# Patient Record
Sex: Male | Born: 1955 | Race: White | Hispanic: No | Marital: Married | Smoking: Former smoker
Health system: Southern US, Community
[De-identification: ages and names within clinical notes are randomized; demographics above are authoritative.]

## PROBLEM LIST (undated history)

## (undated) DIAGNOSIS — I2510 Atherosclerotic heart disease of native coronary artery without angina pectoris: Secondary | ICD-10-CM

## (undated) DIAGNOSIS — J449 Chronic obstructive pulmonary disease, unspecified: Secondary | ICD-10-CM

## (undated) DIAGNOSIS — E7800 Pure hypercholesterolemia, unspecified: Secondary | ICD-10-CM

## (undated) DIAGNOSIS — D689 Coagulation defect, unspecified: Secondary | ICD-10-CM

## (undated) DIAGNOSIS — I10 Essential (primary) hypertension: Secondary | ICD-10-CM

## (undated) DIAGNOSIS — I219 Acute myocardial infarction, unspecified: Secondary | ICD-10-CM

## (undated) DIAGNOSIS — C259 Malignant neoplasm of pancreas, unspecified: Secondary | ICD-10-CM

## (undated) DIAGNOSIS — E119 Type 2 diabetes mellitus without complications: Secondary | ICD-10-CM

## (undated) HISTORY — DX: Coagulation defect, unspecified: D68.9

## (undated) HISTORY — DX: Essential (primary) hypertension: I10

## (undated) HISTORY — PX: CARDIAC SURGERY: SHX584

## (undated) HISTORY — DX: Type 2 diabetes mellitus without complications: E11.9

## (undated) HISTORY — PX: BACK SURGERY: SHX140

## (undated) HISTORY — DX: Pure hypercholesterolemia, unspecified: E78.00

## (undated) HISTORY — DX: Malignant neoplasm of pancreas, unspecified: C25.9

## (undated) HISTORY — DX: Chronic obstructive pulmonary disease, unspecified: J44.9

## (undated) HISTORY — PX: HERNIA REPAIR: SHX51

## (undated) HISTORY — PX: TONSILLECTOMY: SUR1361

## (undated) HISTORY — DX: Atherosclerotic heart disease of native coronary artery without angina pectoris: I25.10

## (undated) HISTORY — DX: Acute myocardial infarction, unspecified: I21.9

---

## 1995-09-27 HISTORY — PX: LUMBAR FUSION: SHX111

## 2000-12-25 ENCOUNTER — Inpatient Hospital Stay (HOSPITAL_COMMUNITY): Admission: AD | Admit: 2000-12-25 | Discharge: 2000-12-27 | Payer: Self-pay | Admitting: Cardiology

## 2001-09-26 HISTORY — PX: OTHER SURGICAL HISTORY: SHX169

## 2003-12-25 ENCOUNTER — Encounter: Admission: RE | Admit: 2003-12-25 | Discharge: 2003-12-25 | Payer: Self-pay | Admitting: Anesthesiology

## 2005-01-06 ENCOUNTER — Ambulatory Visit: Payer: Self-pay | Admitting: Cardiology

## 2005-03-30 ENCOUNTER — Ambulatory Visit: Payer: Self-pay | Admitting: Cardiology

## 2015-12-01 ENCOUNTER — Other Ambulatory Visit: Payer: Self-pay

## 2015-12-01 ENCOUNTER — Encounter: Payer: Self-pay | Admitting: Surgery

## 2015-12-01 DIAGNOSIS — I739 Peripheral vascular disease, unspecified: Secondary | ICD-10-CM

## 2015-12-18 ENCOUNTER — Encounter: Payer: Self-pay | Admitting: Surgery

## 2015-12-28 ENCOUNTER — Ambulatory Visit (INDEPENDENT_AMBULATORY_CARE_PROVIDER_SITE_OTHER): Payer: Medicare PPO | Admitting: Surgery

## 2015-12-28 ENCOUNTER — Encounter: Payer: Self-pay | Admitting: Surgery

## 2015-12-28 ENCOUNTER — Ambulatory Visit (HOSPITAL_COMMUNITY)
Admission: RE | Admit: 2015-12-28 | Discharge: 2015-12-28 | Disposition: A | Discharge status: Home / Self Care | Payer: Medicare PPO | Source: Ambulatory Visit | Attending: Surgery | Admitting: Surgery

## 2015-12-28 ENCOUNTER — Other Ambulatory Visit: Payer: Self-pay | Admitting: Surgery

## 2015-12-28 ENCOUNTER — Ambulatory Visit (INDEPENDENT_AMBULATORY_CARE_PROVIDER_SITE_OTHER)
Admission: RE | Admit: 2015-12-28 | Discharge: 2015-12-28 | Disposition: A | Discharge status: Home / Self Care | Payer: Medicare PPO | Source: Ambulatory Visit | Attending: Surgery | Admitting: Surgery

## 2015-12-28 VITALS — BP 116/77 | HR 74 | Temp 97.9°F | Resp 18 | Ht 69.0 in | Wt 220.0 lb

## 2015-12-28 DIAGNOSIS — I252 Old myocardial infarction: Secondary | ICD-10-CM | POA: Diagnosis not present

## 2015-12-28 DIAGNOSIS — E119 Type 2 diabetes mellitus without complications: Secondary | ICD-10-CM | POA: Diagnosis not present

## 2015-12-28 DIAGNOSIS — J449 Chronic obstructive pulmonary disease, unspecified: Secondary | ICD-10-CM | POA: Insufficient documentation

## 2015-12-28 DIAGNOSIS — I739 Peripheral vascular disease, unspecified: Secondary | ICD-10-CM | POA: Insufficient documentation

## 2015-12-28 DIAGNOSIS — R0989 Other specified symptoms and signs involving the circulatory and respiratory systems: Secondary | ICD-10-CM | POA: Diagnosis present

## 2015-12-28 DIAGNOSIS — I2510 Atherosclerotic heart disease of native coronary artery without angina pectoris: Secondary | ICD-10-CM | POA: Diagnosis not present

## 2015-12-28 DIAGNOSIS — I10 Essential (primary) hypertension: Secondary | ICD-10-CM | POA: Diagnosis not present

## 2015-12-28 MED ORDER — CILOSTAZOL 100 MG PO TABS: 100.0000 mg | ORAL_TABLET | Freq: Two times a day (BID) | ORAL | Status: DC

## 2015-12-28 NOTE — Addendum Note (Signed)
Addended by: Reola Calkins on: 12/28/2015 03:56 PM   Modules accepted: Orders

## 2015-12-28 NOTE — Progress Notes (Signed)
History of Present Illness:  Patient is a 60 y.o. year old male who presents for evaluation of claudication.  The patient currently describes a cramping sensation in the both lower extremity. Most of his pain starts with walking up hills and climbing stairs.  He can walk around [employer]  a few times on good days, but sometimes he has to use a drivable cart.  There is not rest pain.  There is no history of ulcerations on the feet.  Atherosclerotic risk factors and other medical problems include history of smoking, he quit in 2006, CAD, DM, HTN managed with lisinopril and atenolol, and hyperlipidemia managed with Lipitor.    Past Medical History  Diagnosis Date  . Diabetes mellitus without complication ([place])   . Hypertension   . COPD (chronic obstructive pulmonary disease) ([place])   . CAD (coronary artery disease)   . Myocardial infarction [facility])     Past Surgical History  Procedure Laterality Date  . Hemilaminectomy  2003  . Lumbar fusion  1997    L4-5 X2    ROS:   General:  No weight loss, Fever, chills  HEENT: No recent headaches, no nasal bleeding, no visual changes, no sore throat  Neurologic: No dizziness, blackouts, seizures. No recent symptoms of stroke or mini- stroke. No recent episodes of slurred speech, or temporary blindness.  Cardiac: No recent episodes of chest pain/pressure, no shortness of breath at rest.  No shortness of breath with exertion.  Denies history of atrial fibrillation or irregular heartbeat  Vascular: No history of rest pain in feet.  No history of claudication.  No history of non-healing ulcer, No history of DVT   Pulmonary: No home oxygen, no productive cough, no hemoptysis,  No asthma or wheezing  Musculoskeletal:  [ ]  Arthritis, [ ]  Low back pain,  [ ]  Joint pain  Hematologic:No history of hypercoagulable state.  No history of easy bleeding.  No history of anemia  Gastrointestinal: No hematochezia or melena,  No gastroesophageal reflux, no  trouble swallowing  Urinary: [ ]  chronic Kidney disease, [ ]  on HD - [ ]  MWF or [ ]  TTHS, [ ]  Burning with urination, [ ]  Frequent urination, [ ]  Difficulty urinating;   Skin: No rashes  Psychological: No history of anxiety,  No history of depression  Social History Social History  Substance Use Topics  . Smoking status: Former Smoker    Quit date: 11/21/2004  . Smokeless tobacco: None  . Alcohol Use: No    Family History Family History  Problem Relation Age of Onset  . Heart disease Brother     prior to age 8    Allergies  No Known Allergies   Current Outpatient Prescriptions  Medication Sig Dispense Refill  . aspirin 81 MG tablet Take 81 mg by mouth daily.    Marland Kitchen atenolol (TENORMIN) 100 MG tablet Take 1 tablet by mouth daily.    Marland Kitchen atorvastatin (LIPITOR) 10 MG tablet     . gabapentin (NEURONTIN) 600 MG tablet Take 600 mg by mouth 2 (two) times daily.    Marland Kitchen gemfibrozil (LOPID) 600 MG tablet Take 1 tablet by mouth daily.    Marland Kitchen glimepiride (AMARYL) 4 MG tablet Take 1 tablet by mouth 2 (two) times daily.    Marland Kitchen lisinopril (PRINIVIL,ZESTRIL) 20 MG tablet Take 1 tablet by mouth daily.    . metFORMIN (GLUCOPHAGE) 500 MG tablet Take 1 tablet by mouth. 2 tablets every morning and 1 tablet every evening    .  pioglitazone (ACTOS) 15 MG tablet Take 1 tablet by mouth daily.     No current facility-administered medications for this visit.    Physical Examination  Filed Vitals:   12/28/15 1303  BP: 116/77  Pulse: 74  Temp: 97.9 F (36.6 C)  TempSrc: Oral  Resp: 18  Height: 5\' 9"  (1.753 m)  Weight: 220 lb (99.791 kg)  SpO2: 94%    Body mass index is 32.47 kg/(m^2).  General:  Alert and oriented, no acute distress HEENT: Normal Neck: No bruit or JVD Pulmonary: Clear to auscultation bilaterally Cardiac: Regular Rate and Rhythm without murmur Abdomen: Soft, non-tender, non-distended, no mass, no scars Skin: No rash Extremity Pulses:  2+ radial, brachial, femoral, dorsalis  pedis, posterior tibial pulses bilaterally Musculoskeletal: No deformity or edema  Neurologic: Upper and lower extremity motor 5/5 and symmetric  DATA:  Arterial duplex 12/28/2015 Shows SFA occlusion with popliteal reconstitution on the right LE, left SFA is patent until the mid section then reconstitute in the popliteal.  ABI's Right 0.74 Left 0.70 Monophasic flow below the femoral arteries    ASSESSMENT/PLAN: Claudication bilateral LE Bilateral SFA occlusions  We want him to start a walking program 30-40 min a day and PO medication cilostazol BID.   This will include continuation maximum medical management with hi current medications lisinopril, atenolol and Lipitor.   He states that he wants to get better, but he is willing to try these recommendations for 3 months.  If he is no better or worse we will plan angiogram and the possibility of revascularization.      Theda Sers EMMA [facility] PA-C [facility] [place] Office: [phone]  The patient was seen in conjunction with Dr. Trula Slade today.  I agree with the above.  I have seen and evaluated the patient.  Briefly, this is a 60 year old gentleman who describes approximately a one-year history of bilateral claudication, right greater than left.  ABIs in our office today) the 0.7 range.  Duplex shows occluded bilateral superficial femoral arteries.  I discussed the importance of maximal medical therapy as well as the initiation of a exercise program and a trial of cilostazol.  The patient was instructed that if he develops any signs of ischemia to contact me immediately.  Otherwise I will have him follow-up in 3 months.  Annamarie Major

## 2016-01-11 ENCOUNTER — Telehealth: Payer: Self-pay

## 2016-01-11 ENCOUNTER — Telehealth: Payer: Self-pay | Admitting: Surgery

## 2016-01-11 NOTE — Telephone Encounter (Signed)
Spoke to pt to sch appt for 4/24 at 3:15.

## 2016-01-11 NOTE — Telephone Encounter (Signed)
-----   Message from Denman George, RN sent at 01/11/2016 11:43 AM EDT ----- Regarding: needs appt. with [facility] Contact: [phone] Please schedule this pt. to see Dr. Trula Slade at next available appt time.  Last seen in office 12/28/15; was prescribed Cilastazol, and unable to take due to side effects of headaches, chills, and becoming emotional.  Requesting to reschedule appt. due to continued leg pain.

## 2016-01-11 NOTE — Telephone Encounter (Signed)
Pt's wife called to schedule pt. An appt. To see Dr. Trula Slade.  Reported that the pt. Spoke with Dr. Trula Slade on 4/14, and reported he cannot tolerate the Cilastazol, due to side effects of headaches, chills, and becoming emotional.  Reported he was advised to stop taking the Cilastazol.  Wife reported his legs are still very painpul, and requesting another appt. to see Dr. Trula Slade.  Advised will have an appointment scheduler contact pt. with another appt.  Wife agreed.

## 2016-01-14 ENCOUNTER — Encounter: Payer: Self-pay | Admitting: Surgery

## 2016-01-18 ENCOUNTER — Ambulatory Visit (INDEPENDENT_AMBULATORY_CARE_PROVIDER_SITE_OTHER): Payer: Medicare PPO | Admitting: Surgery

## 2016-01-18 ENCOUNTER — Encounter: Payer: Self-pay | Admitting: Surgery

## 2016-01-18 VITALS — BP 131/84 | HR 70 | Ht 69.0 in | Wt 229.0 lb

## 2016-01-18 DIAGNOSIS — I70213 Atherosclerosis of native arteries of extremities with intermittent claudication, bilateral legs: Secondary | ICD-10-CM | POA: Diagnosis not present

## 2016-01-18 DIAGNOSIS — I739 Peripheral vascular disease, unspecified: Secondary | ICD-10-CM | POA: Diagnosis not present

## 2016-01-18 NOTE — Progress Notes (Signed)
[facility]  Patient name: Jake Gonzalez MRN: [id number] DOB: 10-10-1955 Sex: male  REASON FOR VISIT: Follow-up  HPI: Jake Gonzalez is a 60 y.o. male who was seen several weeks ago for bilateral claudication.  He underwent duplex imaging which revealed ankle-brachial indices in the 0.7 range with bilateral superficial femoral artery occlusion.  We discussed the importance of maximal medical therapy as well as the initiation of an exercise program and a trial of cilostazol.  He did not tolerate the cilostazol secondary to headaches and just feeling poorly.  He is back today having stopped the medication but feeling better.  Past Medical History  Diagnosis Date  . Diabetes mellitus without complication ([place])   . Hypertension   . COPD (chronic obstructive pulmonary disease) ([place])   . CAD (coronary artery disease)   . Myocardial infarction [facility])     Family History  Problem Relation Age of Onset  . Heart disease Brother     prior to age 49    SOCIAL HISTORY: Social History  Substance Use Topics  . Smoking status: Former Smoker    Quit date: 11/21/2004  . Smokeless tobacco: Not on file  . Alcohol Use: No    No Known Allergies  Current Outpatient Prescriptions  Medication Sig Dispense Refill  . aspirin 81 MG tablet Take 81 mg by mouth daily.    Marland Kitchen atenolol (TENORMIN) 100 MG tablet Take 1 tablet by mouth daily.    Marland Kitchen atorvastatin (LIPITOR) 10 MG tablet     . cilostazol (PLETAL) 100 MG tablet Take 1 tablet (100 mg total) by mouth 2 (two) times daily before a meal. 60 tablet 0  . gabapentin (NEURONTIN) 600 MG tablet Take 600 mg by mouth 2 (two) times daily.    Marland Kitchen gemfibrozil (LOPID) 600 MG tablet Take 1 tablet by mouth daily.    Marland Kitchen glimepiride (AMARYL) 4 MG tablet Take 1 tablet by mouth 2 (two) times daily.    Marland Kitchen lisinopril (PRINIVIL,ZESTRIL) 20 MG tablet Take 1 tablet by mouth daily.    . metFORMIN (GLUCOPHAGE) 500 MG tablet Take 1 tablet by mouth.  2 tablets every morning and 1 tablet every evening    . pioglitazone (ACTOS) 15 MG tablet Take 1 tablet by mouth daily.     No current facility-administered medications for this visit.    REVIEW OF SYSTEMS:  [X]  denotes positive finding, [ ]  denotes negative finding Cardiac  Comments:  Chest pain or chest pressure:    Shortness of breath upon exertion:    Short of breath when lying flat:    Irregular heart rhythm:        Vascular    Pain in calf, thigh, or hip brought on by ambulation: x   Pain in feet at night that wakes you up from your sleep:     Blood clot in your veins:    Leg swelling:         Pulmonary    Oxygen at home:    Productive cough:     Wheezing:         Neurologic    Sudden weakness in arms or legs:     Sudden numbness in arms or legs:     Sudden onset of difficulty speaking or slurred speech:    Temporary loss of vision in one eye:     Problems with dizziness:         Gastrointestinal    Blood in stool:  Vomited blood:         Genitourinary    Burning when urinating:     Blood in urine:        Psychiatric    Major depression:         Hematologic    Bleeding problems:    Problems with blood clotting too easily:        Skin    Rashes or ulcers:        Constitutional    Fever or chills:      PHYSICAL EXAM: Filed Vitals:   01/18/16 1535  BP: 131/84  Pulse: 70  Height: 5\' 9"  (1.753 m)  Weight: 229 lb (103.874 kg)  SpO2: 95%    GENERAL: The patient is a well-nourished male, in no acute distress. The vital signs are documented above. CARDIAC: There is a regular rate and rhythm.  VASCULAR: Pedal pulses are not palpable PULMONARY: There is good air exchange bilaterally without wheezing or rales. ABDOMEN: Soft and non-tender with normal pitched bowel sounds.  MUSCULOSKELETAL: There are no major deformities or cyanosis. NEUROLOGIC: No focal weakness or paresthesias are detected. SKIN: There are no ulcers or rashes noted. PSYCHIATRIC:  The patient has a normal affect.  DATA:  None  MEDICAL ISSUES: I do not think that the patient's symptoms warrant intervention at this time.  We have extensively discussed with him how to participate in a walking program and to try to help develop more collateral blood flow to help improve his walking distance.  He is going to try to do this over the next year and will follow up with me with repeat ABIs in one year.  He understands if he develops a wound that does not heal in his foot to contact me.    Annamarie Major [facility] [employer]: [phone]

## 2016-02-23 ENCOUNTER — Other Ambulatory Visit: Payer: Self-pay | Admitting: *Deleted

## 2016-02-23 DIAGNOSIS — I739 Peripheral vascular disease, unspecified: Secondary | ICD-10-CM

## 2016-04-04 ENCOUNTER — Ambulatory Visit: Payer: Medicare PPO | Admitting: Surgery

## 2016-06-07 ENCOUNTER — Encounter (HOSPITAL_COMMUNITY): Payer: Medicare PPO | Attending: Hematology & Oncology | Admitting: Hematology & Oncology

## 2016-06-07 ENCOUNTER — Encounter (HOSPITAL_COMMUNITY): Payer: Self-pay | Admitting: Hematology & Oncology

## 2016-06-07 VITALS — BP 126/66 | HR 85 | Temp 98.9°F | Resp 16 | Ht 67.0 in | Wt 210.8 lb

## 2016-06-07 DIAGNOSIS — I252 Old myocardial infarction: Secondary | ICD-10-CM | POA: Diagnosis not present

## 2016-06-07 DIAGNOSIS — K8689 Other specified diseases of pancreas: Secondary | ICD-10-CM

## 2016-06-07 DIAGNOSIS — R160 Hepatomegaly, not elsewhere classified: Secondary | ICD-10-CM | POA: Diagnosis not present

## 2016-06-07 DIAGNOSIS — Z7901 Long term (current) use of anticoagulants: Secondary | ICD-10-CM | POA: Insufficient documentation

## 2016-06-07 DIAGNOSIS — C787 Secondary malignant neoplasm of liver and intrahepatic bile duct: Secondary | ICD-10-CM | POA: Insufficient documentation

## 2016-06-07 DIAGNOSIS — D689 Coagulation defect, unspecified: Secondary | ICD-10-CM | POA: Insufficient documentation

## 2016-06-07 DIAGNOSIS — R918 Other nonspecific abnormal finding of lung field: Secondary | ICD-10-CM

## 2016-06-07 DIAGNOSIS — I10 Essential (primary) hypertension: Secondary | ICD-10-CM | POA: Insufficient documentation

## 2016-06-07 DIAGNOSIS — R634 Abnormal weight loss: Secondary | ICD-10-CM

## 2016-06-07 DIAGNOSIS — Z79899 Other long term (current) drug therapy: Secondary | ICD-10-CM | POA: Diagnosis not present

## 2016-06-07 DIAGNOSIS — F418 Other specified anxiety disorders: Secondary | ICD-10-CM

## 2016-06-07 DIAGNOSIS — C7800 Secondary malignant neoplasm of unspecified lung: Secondary | ICD-10-CM | POA: Insufficient documentation

## 2016-06-07 DIAGNOSIS — R1084 Generalized abdominal pain: Secondary | ICD-10-CM | POA: Diagnosis not present

## 2016-06-07 DIAGNOSIS — R161 Splenomegaly, not elsewhere classified: Secondary | ICD-10-CM | POA: Diagnosis not present

## 2016-06-07 DIAGNOSIS — C259 Malignant neoplasm of pancreas, unspecified: Secondary | ICD-10-CM | POA: Diagnosis not present

## 2016-06-07 DIAGNOSIS — G893 Neoplasm related pain (acute) (chronic): Secondary | ICD-10-CM | POA: Diagnosis not present

## 2016-06-07 DIAGNOSIS — Z87891 Personal history of nicotine dependence: Secondary | ICD-10-CM | POA: Diagnosis not present

## 2016-06-07 DIAGNOSIS — Z7984 Long term (current) use of oral hypoglycemic drugs: Secondary | ICD-10-CM | POA: Insufficient documentation

## 2016-06-07 DIAGNOSIS — E7800 Pure hypercholesterolemia, unspecified: Secondary | ICD-10-CM | POA: Insufficient documentation

## 2016-06-07 DIAGNOSIS — E119 Type 2 diabetes mellitus without complications: Secondary | ICD-10-CM | POA: Insufficient documentation

## 2016-06-07 DIAGNOSIS — I82402 Acute embolism and thrombosis of unspecified deep veins of left lower extremity: Secondary | ICD-10-CM

## 2016-06-07 DIAGNOSIS — R591 Generalized enlarged lymph nodes: Secondary | ICD-10-CM | POA: Diagnosis not present

## 2016-06-07 DIAGNOSIS — K869 Disease of pancreas, unspecified: Secondary | ICD-10-CM

## 2016-06-07 DIAGNOSIS — J449 Chronic obstructive pulmonary disease, unspecified: Secondary | ICD-10-CM | POA: Diagnosis not present

## 2016-06-07 DIAGNOSIS — I2510 Atherosclerotic heart disease of native coronary artery without angina pectoris: Secondary | ICD-10-CM | POA: Insufficient documentation

## 2016-06-07 LAB — COMPREHENSIVE METABOLIC PANEL
ALK PHOS: 154 U/L — AB (ref 38–126)
ALT: 23 U/L (ref 17–63)
ANION GAP: 7 (ref 5–15)
AST: 31 U/L (ref 15–41)
Albumin: 3.8 g/dL (ref 3.5–5.0)
BILIRUBIN TOTAL: 0.5 mg/dL (ref 0.3–1.2)
BUN: 20 mg/dL (ref 6–20)
CALCIUM: 8.8 mg/dL — AB (ref 8.9–10.3)
CO2: 27 mmol/L (ref 22–32)
Chloride: 97 mmol/L — ABNORMAL LOW (ref 101–111)
Creatinine, Ser: 0.93 mg/dL (ref 0.61–1.24)
Glucose, Bld: 128 mg/dL — ABNORMAL HIGH (ref 65–99)
Potassium: 4.9 mmol/L (ref 3.5–5.1)
Sodium: 131 mmol/L — ABNORMAL LOW (ref 135–145)
TOTAL PROTEIN: 7.8 g/dL (ref 6.5–8.1)

## 2016-06-07 LAB — CBC WITH DIFFERENTIAL/PLATELET
Basophils Absolute: 0.1 10*3/uL (ref 0.0–0.1)
Basophils Relative: 1 %
EOS ABS: 0.1 10*3/uL (ref 0.0–0.7)
Eosinophils Relative: 1 %
HEMATOCRIT: 40 % (ref 39.0–52.0)
HEMOGLOBIN: 13 g/dL (ref 13.0–17.0)
LYMPHS ABS: 2.1 10*3/uL (ref 0.7–4.0)
Lymphocytes Relative: 20 %
MCH: 28.6 pg (ref 26.0–34.0)
MCHC: 32.5 g/dL (ref 30.0–36.0)
MCV: 87.9 fL (ref 78.0–100.0)
MONOS PCT: 13 %
Monocytes Absolute: 1.3 10*3/uL — ABNORMAL HIGH (ref 0.1–1.0)
NEUTROS ABS: 6.8 10*3/uL (ref 1.7–7.7)
NEUTROS PCT: 65 %
Platelets: 194 10*3/uL (ref 150–400)
RBC: 4.55 MIL/uL (ref 4.22–5.81)
RDW: 13.4 % (ref 11.5–15.5)
WBC: 10.4 10*3/uL (ref 4.0–10.5)

## 2016-06-07 MED ORDER — ONDANSETRON HCL 8 MG PO TABS: 8.0000 mg | ORAL_TABLET | Freq: Three times a day (TID) | ORAL | 1 refills | Status: DC | PRN

## 2016-06-07 MED ORDER — LORAZEPAM 0.5 MG PO TABS: 0.5000 mg | ORAL_TABLET | ORAL | 0 refills | Status: AC | PRN

## 2016-06-07 MED ORDER — MORPHINE SULFATE ER 15 MG PO TBCR: EXTENDED_RELEASE_TABLET | ORAL | 0 refills | Status: DC

## 2016-06-07 MED ORDER — MORPHINE SULFATE ER 30 MG PO TBCR: EXTENDED_RELEASE_TABLET | ORAL | 0 refills | Status: DC

## 2016-06-07 NOTE — Progress Notes (Signed)
Jake Gonzalez NOTE  Patient Care Team: Jake Chroman, MD as PCP - General (Internal Medicine) Jake Situ, MD (Pain Medicine)  CHIEF COMPLAINTS/PURPOSE OF CONSULTATION:  Abdominal pain Pancreatic mass Liver mass Lymphadenopathy Splenic mass Bilateral adrenal nodules Multiple pulmonary nodules  HISTORY OF PRESENTING ILLNESS:  Jake Gonzalez 60 y.o. male is here for a consult due to abnormal CT imaging of the C/A/P.   Patient is accompanied by wife, son, and son's girlfriend.  Jake Gonzalez has been experiencing abdominal pain, nausea, vomiting, anxiety, tingling in his hands and feet. He is tearful frequently throughout our visit today.  He has chronic LBP and is on a pain contract. Patient is on long acting morphine 30 mg and 15 mg for break through, these are his prior chronic pain medications. . He does not feel like it is alleviating his new/current pain to the full extent but it is helping him sleep at night and it is reducing the pain in his side. He says he feels like he may need a a higher dose.  When I asked him if he has considered a morphine patch, he said that his insurance will not cover it. Although patient says he does not wake up with pain, he begins to experience it as the day progresses. Patient is constipated from pain medicine.  He notes that about a month ago he developed LLE swelling and pain, he was diagnosed with a DVT. He was originally started on Eliquis. He began having problems with abdominal pain, nausea and vomiting which he initially attributed to the Xarelto. He is now on  (5 mg eliquis).  About two weeks ago, he began having abdominal pain and that is what led to him getting a CT scan. He sees Dr. Woody Gonzalez for his primary care needs. He notes the abdominal pain is cramping in nature and radiates into his back and LLQ. He was given bentyl which has helped some with his pain. He was having significant nausea and vomiting but since using bentyl,  this has improved. He states that the scans revealed a tumor in his pancreas and also he thinks some areas of the liver may be affected.   Jake Gonzalez has been taking bentyl for nausea. He still has difficulty eating and his wife says he can only eat small portions. He has lost about twenty pounds. He is no longer vomiting.He notes that his weight loss has currently stabilized.   MEDICAL HISTORY:  Past Medical History:  Diagnosis Date  . CAD (coronary artery disease)   . Clotting disorder ([place])   . COPD (chronic obstructive pulmonary disease) ([place])   . Diabetes mellitus without complication ([place])   . High cholesterol   . Hypertension   . Myocardial infarction ([place])   . Pancreatic cancer [facility])     SURGICAL HISTORY: Past Surgical History:  Procedure Laterality Date  . BACK SURGERY    . hemilaminectomy  2003  . HERNIA REPAIR    . LUMBAR FUSION  1997   L4-5 X2  . TONSILLECTOMY      SOCIAL HISTORY: Social History   Social History  . Marital status: Married    Spouse name: N/A  . Number of children: N/A  . Years of education: N/A   Occupational History  . Not on file.   Social History Main Topics  . Smoking status: Former Smoker    Packs/day: 2.00    Years: 35.00    Quit date: 11/21/2004  . Smokeless  tobacco: Never Used  . Alcohol use No  . Drug use: No  . Sexual activity: Not on file     Comment: married- 2 sons   Other Topics Concern  . Not on file   Social History Narrative  . No narrative on file  Jake Gonzalez's been married since 2001 to his second wife. He has two children. 4 great grandchildren.  He worked in [employer].  Patient does not consume alcohol.  Patient quit smoking Feb. 6, 2006  FAMILY HISTORY: Family History  Problem Relation Age of Onset  . Heart disease Brother     prior to age 26  . Stroke Brother   . Hypertension Brother   . Cancer Mother     ovarian  . Alcoholism Father   . Cancer Maternal Uncle     stomach  . Other Brother     MVA    Mother had ovarian cancer and stomach cancer; deceased at 74 years old Uncle possibly had stomach cancer Father was an alcoholic and died at 43 years old. Most of father's family was alcoholic. Brother had heart attack  ALLERGIES:  has No Known Allergies.  MEDICATIONS:  Current Outpatient Prescriptions  Medication Sig Dispense Refill  . apixaban (ELIQUIS) 5 MG TABS tablet Take 5 mg by mouth at bedtime.    Marland Kitchen atenolol (TENORMIN) 100 MG tablet Take 1 tablet by mouth daily.    Marland Kitchen atorvastatin (LIPITOR) 10 MG tablet     . cilostazol (PLETAL) 100 MG tablet Take 1 tablet (100 mg total) by mouth 2 (two) times daily before a meal. 60 tablet 0  . dicyclomine (BENTYL) 10 MG capsule Take 10 mg by mouth 3 (three) times daily as needed for spasms.    Marland Kitchen gemfibrozil (LOPID) 600 MG tablet Take 1 tablet by mouth daily.    Marland Kitchen glimepiride (AMARYL) 4 MG tablet Take 1 tablet by mouth 2 (two) times daily.    Marland Kitchen lisinopril (PRINIVIL,ZESTRIL) 20 MG tablet Take 1 tablet by mouth daily.    . metFORMIN (GLUCOPHAGE) 500 MG tablet Take 1 tablet by mouth. 2 tablets every morning and 1 tablet every evening    . morphine (MS CONTIN) 15 MG 12 hr tablet Take 15 mg by mouth every 4 (four) hours as needed for pain.    Marland Kitchen morphine (MS CONTIN) 30 MG 12 hr tablet Take 30 mg by mouth every 12 (twelve) hours.    . pioglitazone (ACTOS) 15 MG tablet Take 1 tablet by mouth daily.    . Red Yeast Rice 600 MG CAPS Take 1 capsule by mouth daily.    Marland Kitchen LORazepam (ATIVAN) 0.5 MG tablet Take 1 tablet (0.5 mg total) by mouth every 4 (four) hours as needed for anxiety (and nausea). 60 tablet 0  . morphine (MS CONTIN) 15 MG 12 hr tablet Take one 15 mg tab and one 30 mg tab to equal 45 mg every 12 hours. 60 tablet 0  . morphine (MS CONTIN) 30 MG 12 hr tablet Take one 15 mg tab and one 30 mg tab to equal 45 mg every 12 hours 60 tablet 0  . ondansetron (ZOFRAN) 8 MG tablet Take 1 tablet (8 mg total) by mouth every 8 (eight) hours as needed for nausea  or vomiting. 30 tablet 1   No current facility-administered medications for this visit.     Review of Systems  Constitutional: Positive for malaise/fatigue and weight loss.  HENT: Negative.  Negative for ear pain, hearing loss and tinnitus.  Eyes: Negative.   Respiratory: Negative.  Negative for cough, hemoptysis, sputum production, shortness of breath and wheezing.   Cardiovascular: Negative.  Negative for chest pain, palpitations, orthopnea, claudication, leg swelling and PND.  Gastrointestinal: Positive for abdominal pain, constipation and nausea. Negative for blood in stool, heartburn, melena and vomiting.  Genitourinary: Negative.  Negative for dysuria, flank pain, frequency, hematuria and urgency.  Musculoskeletal: Positive for back pain and joint pain.  Skin: Negative.   Neurological: Positive for tingling and weakness. Negative for headaches.       Tingling in hands and feet  Endo/Heme/Allergies: Negative.   Psychiatric/Behavioral: Negative for depression, hallucinations, memory loss, substance abuse and suicidal ideas. The patient is nervous/anxious. The patient does not have insomnia.   All other systems reviewed and are negative.  14 point ROS was done and is otherwise as detailed above or in HPI   PHYSICAL EXAMINATION: ECOG PERFORMANCE STATUS: 1 - Symptomatic but completely ambulatory  Vitals:   06/07/16 1557  BP: 126/66  Pulse: 85  Resp: 16  Temp: 98.9 F (37.2 C)   Filed Weights   06/07/16 1557  Weight: 210 lb 12.8 oz (95.6 kg)     Physical Exam  Constitutional: He is oriented to person, place, and time and well-developed, well-nourished, and in no distress.  HENT:  Head: Normocephalic and atraumatic.  Nose: Nose normal.  Mouth/Throat: Oropharynx is clear and moist. No oropharyngeal exudate.  Eyes: Conjunctivae and EOM are normal. Pupils are equal, round, and reactive to light. Right eye exhibits no discharge. Left eye exhibits no discharge. No scleral  icterus.  Neck: Normal range of motion. Neck supple. No tracheal deviation present. No thyromegaly present.  Cardiovascular: Normal rate, regular rhythm and normal heart sounds.  Exam reveals no gallop and no friction rub.   No murmur heard. Left leg swollen due to blood clot  Pulmonary/Chest: Effort normal and breath sounds normal. He has no wheezes. He has no rales.  Abdominal: Soft. Bowel sounds are normal. He exhibits no distension and no mass. There is tenderness. There is no rebound and no guarding.  Musculoskeletal: Normal range of motion. He exhibits edema.  LLE > RLE  Lymphadenopathy:    He has no cervical adenopathy.  Neurological: He is alert and oriented to person, place, and time. He has normal reflexes. No cranial nerve deficit. Gait normal. Coordination normal.  Skin: Skin is warm and dry. No rash noted.  Psychiatric: Mood, memory, affect and judgment normal.  Nursing note and vitals reviewed.   LABORATORY DATA:  I have reviewed the data as listed No results found for: WBC, HGB, HCT, MCV, PLT CMP  No results found for: NA, K, CL, CO2, GLUCOSE, BUN, CREATININE, CALCIUM, PROT, ALBUMIN, AST, ALT, ALKPHOS, BILITOT, GFRNONAA, GFRAA   RADIOGRAPHIC STUDIES: I have personally reviewed the radiological images as listed and agreed with the findings in the report. No results found.    ASSESSMENT & PLAN: Abdominal pain Pancreatic mass Liver mass Lymphadenopathy Splenic mass Bilateral adrenal nodules Multiple pulmonary nodules Weight loss LLE DVT Chronic Anticoagulation HX chronic back pain Anxiety   Patient was very emotional over results of CT scan. He felt very confused about his diagnosis/prognosis. I explained to Jake Gonzalez that we need a biopsy for a definitive diagnosis, but based upon imaging and presentation; clinical picture is certainly concerning for an advanced pancreatic cancer.  I told Jake Gonzalez that he would need to stop the eliquis for 2-3 days prior to biopsy.  I informed him that once  the biopsy is completed, we can determine appropriate staging and treatment.   He is cleared to stop his eliquis for 2 to 3 days prior to his biopsy in order to obtain diagnosis. Biopsy will be obtained in IR.   We will obtain labs today including a CA 19-9.  I told the patient to keep taking bentyl, since it seems to be helping his abdominal cramping.Marland Kitchen However, I will also prescribe him Zofran for nausea and ativan for anxiety . I will increase his long-acting pain medication and the frequency of his short-acting medication. The patient is currently under pain contract. I told him to tell his pain doctor that he is seeing a medical oncologist who will take over the pain medication. I advised him that I may need to personally call his pain physician as well.   I will set the patient up with Jake Gonzalez, our nutritionist.. I recommended that he try Boost or Ensure to help him keep his weight up. I also suggested he eat small, frequent meals.   I have set him up with Jake Gonzalez, our patient navigator.   Jake Gonzalez was given a constipation sheet.   Follow up with patient 3 days after biopsy.  ORDERS PLACED FOR THIS ENCOUNTER: Orders Placed This Encounter  Procedures  . US Biopsy  . CBC with Differential  . Comprehensive metabolic panel  . Cancer antigen 19-9    MEDICATIONS PRESCRIBED THIS ENCOUNTER: Meds ordered this encounter  Medications  . morphine (MS CONTIN) 30 MG 12 hr tablet    Sig: Take 30 mg by mouth every 12 (twelve) hours.  Marland Kitchen apixaban (ELIQUIS) 5 MG TABS tablet    Sig: Take 5 mg by mouth at bedtime.  . dicyclomine (BENTYL) 10 MG capsule    Sig: Take 10 mg by mouth 3 (three) times daily as needed for spasms.  Marland Kitchen morphine (MS CONTIN) 15 MG 12 hr tablet    Sig: Take 15 mg by mouth every 4 (four) hours as needed for pain.  . Red Yeast Rice 600 MG CAPS    Sig: Take 1 capsule by mouth daily.  Marland Kitchen morphine (MS CONTIN) 15 MG 12 hr tablet    Sig: Take one 15 mg tab  and one 30 mg tab to equal 45 mg every 12 hours.    Dispense:  60 tablet    Refill:  0  . morphine (MS CONTIN) 30 MG 12 hr tablet    Sig: Take one 15 mg tab and one 30 mg tab to equal 45 mg every 12 hours    Dispense:  60 tablet    Refill:  0  . LORazepam (ATIVAN) 0.5 MG tablet    Sig: Take 1 tablet (0.5 mg total) by mouth every 4 (four) hours as needed for anxiety (and nausea).    Dispense:  60 tablet    Refill:  0  . ondansetron (ZOFRAN) 8 MG tablet    Sig: Take 1 tablet (8 mg total) by mouth every 8 (eight) hours as needed for nausea or vomiting.    Dispense:  30 tablet    Refill:  1     All questions were answered. The patient knows to call the clinic with any problems, questions or concerns.  This document serves as a record of services personally performed by Ancil Linsey, MD. It was created on her behalf by Elmyra Ricks, a trained medical scribe. The creation of this record is based on the scribe's personal observations and the provider's statements to  them. This document has been checked and approved by the attending provider.  I have reviewed the above documentation for accuracy and completeness, and I agree with the above.  This note was electronically signed.  Molli Hazard, MD   06/07/2016 5:59 PM

## 2016-06-07 NOTE — Patient Instructions (Addendum)
[place] at [facility] Discharge Instructions  RECOMMENDATIONS MADE BY THE CONSULTANT AND ANY TEST RESULTS WILL BE SENT TO YOUR REFERRING PHYSICIAN.  You saw Dr. Whitney Muse today. You will be scheduled for liver biopsy. Follow up 3 days after biopsy. Increase pain medicine:  take MS contin 45 mg every 12 hours ( one 30 mg pill and one 15 mg pill) Take MS contin 15 mg every 4 hours as needed for pain   Thank you for choosing [place] at [facility] to provide your oncology and hematology care.  To afford each patient quality time with our provider, please arrive at least 15 minutes before your scheduled appointment time.   Beginning January 23rd 2017 lab work for the [facility] will be done in the  Main lab at [facility] on 1st floor. If you have a lab appointment with the [facility] please come in thru the  Main Entrance and check in at the main information desk  You need to re-schedule your appointment should you arrive 10 or more minutes late.  We strive to give you quality time with our providers, and arriving late affects you and other patients whose appointments are after yours.  Also, if you no show three or more times for appointments you may be dismissed from the clinic at the providers discretion.     Again, thank you for choosing [facility].  Our hope is that these requests will decrease the amount of time that you wait before being seen by our physicians.       _____________________________________________________________  Should you have questions after your visit to [facility], please contact our office at [phone] between the hours of 8:30 a.m. and 4:30 p.m.  Voicemails left after 4:30 p.m. will not be returned until the following business day.  For prescription refill requests, have your pharmacy contact our office.         Resources For Cancer Patients and their  Caregivers ? American Cancer Society: Can assist with transportation, wigs, general needs, runs Look Good Feel Better.        (743)081-9567 ? Cancer Care: Provides financial assistance, online support groups, medication/co-pay assistance.  1-800-813-HOPE 503-169-2513) ? [facility] Assists [place] cancer patients and their families through emotional , educational and financial support.  (209)425-4110 ? [place] DSS Where to apply for food stamps, Medicaid and utility assistance. 7814029920 ? [facility]: Transportation to medical appointments. (810) 008-4168 ? Social Security Administration: May apply for disability if have a Stage IV cancer. 3167118194 (323) 761-6518 ? [employer], Disability and Transit Services: Assists with nutrition, care and transit needs. [place] Support Programs: @10RELATIVEDAYS @ > Cancer Support Group  2nd Tuesday of the month 1pm-2pm, Journey Room  > Creative Journey  3rd Tuesday of the month 1130am-1pm, Journey Room  > Look Good Feel Better  1st Wednesday of the month 10am-12 noon, Journey Room (Call [facility] to register [phone])

## 2016-06-07 NOTE — Progress Notes (Signed)
Met with pt face to face.  Introduced myself and explain a little bit about my role as the patient navigator.  Pt given a card with all my information on it.  Told pt to call if they had any questions or concerns.  Pt verbalized understanding.

## 2016-06-08 ENCOUNTER — Other Ambulatory Visit (HOSPITAL_COMMUNITY): Payer: Medicare PPO

## 2016-06-08 LAB — CANCER ANTIGEN 19-9: CA 19-9: 75141 U/mL — ABNORMAL HIGH (ref 0–35)

## 2016-06-09 ENCOUNTER — Other Ambulatory Visit (HOSPITAL_COMMUNITY): Payer: Medicare PPO

## 2016-06-09 ENCOUNTER — Telehealth (HOSPITAL_COMMUNITY): Payer: Self-pay | Admitting: Emergency Medicine

## 2016-06-09 NOTE — Telephone Encounter (Signed)
pts wife called and wanted me to write a letter for grand jury excusing her for the rest of the year. I completed the letter and faxed it to the grand jury [phone]

## 2016-06-12 ENCOUNTER — Encounter (HOSPITAL_COMMUNITY): Payer: Self-pay | Admitting: Hematology & Oncology

## 2016-06-12 DIAGNOSIS — K8689 Other specified diseases of pancreas: Secondary | ICD-10-CM | POA: Insufficient documentation

## 2016-06-13 ENCOUNTER — Other Ambulatory Visit: Payer: Self-pay | Admitting: Radiology

## 2016-06-13 ENCOUNTER — Other Ambulatory Visit (HOSPITAL_COMMUNITY): Payer: Self-pay | Admitting: Emergency Medicine

## 2016-06-13 ENCOUNTER — Other Ambulatory Visit: Payer: Self-pay | Admitting: General Surgery

## 2016-06-14 ENCOUNTER — Ambulatory Visit (HOSPITAL_COMMUNITY)
Admission: RE | Admit: 2016-06-14 | Discharge: 2016-06-14 | Disposition: A | Discharge status: Home / Self Care | Payer: Medicare PPO | Source: Ambulatory Visit | Attending: Hematology & Oncology | Admitting: Hematology & Oncology

## 2016-06-14 ENCOUNTER — Other Ambulatory Visit (HOSPITAL_COMMUNITY): Payer: Self-pay | Admitting: Hematology & Oncology

## 2016-06-14 ENCOUNTER — Encounter (HOSPITAL_COMMUNITY): Payer: Self-pay

## 2016-06-14 ENCOUNTER — Other Ambulatory Visit (HOSPITAL_COMMUNITY): Payer: Self-pay | Admitting: Interventional Radiology

## 2016-06-14 DIAGNOSIS — R160 Hepatomegaly, not elsewhere classified: Secondary | ICD-10-CM

## 2016-06-14 DIAGNOSIS — K8689 Other specified diseases of pancreas: Secondary | ICD-10-CM

## 2016-06-14 DIAGNOSIS — C787 Secondary malignant neoplasm of liver and intrahepatic bile duct: Secondary | ICD-10-CM | POA: Diagnosis not present

## 2016-06-14 DIAGNOSIS — K769 Liver disease, unspecified: Secondary | ICD-10-CM | POA: Diagnosis present

## 2016-06-14 DIAGNOSIS — I82402 Acute embolism and thrombosis of unspecified deep veins of left lower extremity: Secondary | ICD-10-CM

## 2016-06-14 LAB — CBC
HEMATOCRIT: 38.3 % — AB (ref 39.0–52.0)
Hemoglobin: 12.1 g/dL — ABNORMAL LOW (ref 13.0–17.0)
MCH: 27.8 pg (ref 26.0–34.0)
MCHC: 31.6 g/dL (ref 30.0–36.0)
MCV: 88 fL (ref 78.0–100.0)
PLATELETS: 196 10*3/uL (ref 150–400)
RBC: 4.35 MIL/uL (ref 4.22–5.81)
RDW: 13.3 % (ref 11.5–15.5)
WBC: 9.1 10*3/uL (ref 4.0–10.5)

## 2016-06-14 LAB — PROTIME-INR
INR: 1.17
Prothrombin Time: 14.9 seconds (ref 11.4–15.2)

## 2016-06-14 LAB — GLUCOSE, CAPILLARY: GLUCOSE-CAPILLARY: 146 mg/dL — AB (ref 65–99)

## 2016-06-14 MED ORDER — FENTANYL CITRATE (PF) 100 MCG/2ML IJ SOLN
INTRAMUSCULAR | Status: AC | PRN
  Administered 2016-06-14: 50 ug via INTRAVENOUS
  Administered 2016-06-14: 25 ug via INTRAVENOUS
  Administered 2016-06-14: 50 ug via INTRAVENOUS

## 2016-06-14 MED ORDER — SODIUM CHLORIDE 0.9 % IV SOLN: INTRAVENOUS | Status: DC

## 2016-06-14 MED ORDER — MIDAZOLAM HCL 2 MG/2ML IJ SOLN
INTRAMUSCULAR | Status: AC | PRN
  Administered 2016-06-14 (×2): 1 mg via INTRAVENOUS

## 2016-06-14 MED ORDER — GELATIN ABSORBABLE 12-7 MM EX MISC
CUTANEOUS | Status: AC
  Filled 2016-06-14: qty 1

## 2016-06-14 MED ORDER — MIDAZOLAM HCL 2 MG/2ML IJ SOLN
INTRAMUSCULAR | Status: AC
  Filled 2016-06-14: qty 2

## 2016-06-14 MED ORDER — LIDOCAINE HCL 1 % IJ SOLN
INTRAMUSCULAR | Status: AC
  Filled 2016-06-14: qty 20

## 2016-06-14 MED ORDER — FENTANYL CITRATE (PF) 100 MCG/2ML IJ SOLN
INTRAMUSCULAR | Status: AC
  Filled 2016-06-14: qty 2

## 2016-06-14 NOTE — Sedation Documentation (Signed)
Patient is resting comfortably. 

## 2016-06-14 NOTE — Sedation Documentation (Signed)
Vital signs stable. 

## 2016-06-14 NOTE — Discharge Instructions (Signed)
Liver Biopsy, Care After °Refer to this sheet in the next few weeks. These instructions provide you with information on caring for yourself after your procedure. Your health care provider may also give you more specific instructions. Your treatment has been planned according to current medical practices, but problems sometimes occur. Call your health care provider if you have any problems or questions after your procedure. °WHAT TO EXPECT AFTER THE PROCEDURE °After your procedure, it is typical to have the following: °· A small amount of discomfort in the area where the biopsy was done and in the right shoulder or shoulder blade. °· A small amount of bruising around the area where the biopsy was done and on the skin over the liver. °· Sleepiness and fatigue for the rest of the day. °HOME CARE INSTRUCTIONS  °· Rest at home for 1-2 days or as directed by your health care provider. °· Have a friend or family member stay with you for at least 24 hours. °· Because of the medicines used during the procedure, you should not do the following things in the first 24 hours: °¨ Drive. °¨ Use machinery. °¨ Be responsible for the care of other people. °¨ Sign legal documents. °¨ Take a bath or shower. °· There are many different ways to close and cover an incision, including stitches, skin glue, and adhesive strips. Follow your health care provider's instructions on: °¨ Incision care. °¨ Bandage (dressing) changes and removal. °¨ Incision closure removal. °· Do not drink alcohol in the first week. °· Do not lift more than 5 pounds or play contact sports for 2 weeks after this test. °· Take medicines only as directed by your health care provider. Do not take medicine containing aspirin or non-steroidal anti-inflammatory medicines such as ibuprofen for 1 week after this test. °· It is your responsibility to get your test results. °SEEK MEDICAL CARE IF:  °· You have increased bleeding from an incision that results in more than a  small spot of blood. °· You have redness, swelling, or increasing pain in any incisions. °· You notice a discharge or a bad smell coming from any of your incisions. °· You have a fever or chills. °SEEK IMMEDIATE MEDICAL CARE IF:  °· You develop swelling, bloating, or pain in your abdomen. °· You become dizzy or faint. °· You develop a rash. °· You are nauseous or vomit. °· You have difficulty breathing, feel short of breath, or feel faint. °· You develop chest pain. °· You have problems with your speech or vision. °· You have trouble balancing or moving your arms or legs. °  °This information is not intended to replace advice given to you by your health care provider. Make sure you discuss any questions you have with your health care provider. °  °Document Released: 04/01/2005 Document Revised: 10/03/2014 Document Reviewed: 11/08/2013 °Elsevier Interactive Patient Education ©2016 [employer] Inc. ° °

## 2016-06-14 NOTE — Sedation Documentation (Signed)
Attempted to call report to SS, no rn available at this time.

## 2016-06-14 NOTE — Sedation Documentation (Signed)
Vital signs stable. No complaints from pt at this time.  

## 2016-06-14 NOTE — Procedures (Signed)
R lobe liver Bx 18 g times two No comp/EBL

## 2016-06-14 NOTE — H&P (Signed)
Chief Complaint: Patient was seen in consultation today for liver lesion biopsy at the request of Penland,Shannon K  Referring Physician(s): Penland,Shannon K  Supervising Physician: Marybelle Killings  Patient Status: Outpatient  History of Present Illness: Jake Gonzalez is a 60 y.o. male   Discovered LLE DVT approx 1 month ago after Left leg swelling noted Using Eliquis now Developed lower left sided abd pain radiating to back and left leg PMD Dr Woody Seller follows pt Imaging from outside facility shows pancreatic mass with liver and peritoneal metastasis + wt loss Imaging reviewed with Radiologist Approved for liver biopsy   Past Medical History:  Diagnosis Date  . CAD (coronary artery disease)   . Clotting disorder ([place])   . COPD (chronic obstructive pulmonary disease) ([place])   . Diabetes mellitus without complication ([place])   . High cholesterol   . Hypertension   . Myocardial infarction ([place])   . Pancreatic cancer [facility])     Past Surgical History:  Procedure Laterality Date  . BACK SURGERY    . hemilaminectomy  2003  . HERNIA REPAIR    . LUMBAR FUSION  1997   L4-5 X2  . TONSILLECTOMY      Allergies: Review of patient's allergies indicates no known allergies.  Medications: Prior to Admission medications   Medication Sig Start Date End Date Taking? Authorizing Provider  apixaban (ELIQUIS) 5 MG TABS tablet Take 5 mg by mouth at bedtime.   Yes Historical Provider, MD  atenolol (TENORMIN) 100 MG tablet Take 1 tablet by mouth daily. 12/25/15  Yes Historical Provider, MD  atorvastatin (LIPITOR) 10 MG tablet  11/18/15  Yes Historical Provider, MD  cilostazol (PLETAL) 100 MG tablet Take 1 tablet (100 mg total) by mouth 2 (two) times daily before a meal. 12/28/15  Yes Serafina Mitchell, MD  gemfibrozil (LOPID) 600 MG tablet Take 1 tablet by mouth daily. 11/05/15  Yes Historical Provider, MD  glimepiride (AMARYL) 4 MG tablet Take 1 tablet by mouth 2 (two) times daily. 11/16/15  Yes  Historical Provider, MD  lisinopril (PRINIVIL,ZESTRIL) 20 MG tablet Take 1 tablet by mouth daily. 10/29/15  Yes Historical Provider, MD  LORazepam (ATIVAN) 0.5 MG tablet Take 1 tablet (0.5 mg total) by mouth every 4 (four) hours as needed for anxiety (and nausea). 06/07/16  Yes Patrici Ranks, MD  metFORMIN (GLUCOPHAGE) 500 MG tablet Take 1 tablet by mouth. 2 tablets every morning and 1 tablet every evening 12/07/15  Yes Historical Provider, MD  morphine (MS CONTIN) 15 MG 12 hr tablet Take 15 mg by mouth every 4 (four) hours as needed for pain.   Yes Historical Provider, MD  morphine (MS CONTIN) 15 MG 12 hr tablet Take one 15 mg tab and one 30 mg tab to equal 45 mg every 12 hours. 06/07/16  Yes Patrici Ranks, MD  morphine (MS CONTIN) 30 MG 12 hr tablet Take 30 mg by mouth every 12 (twelve) hours.   Yes Historical Provider, MD  morphine (MS CONTIN) 30 MG 12 hr tablet Take one 15 mg tab and one 30 mg tab to equal 45 mg every 12 hours 06/07/16  Yes Patrici Ranks, MD  pioglitazone (ACTOS) 15 MG tablet Take 1 tablet by mouth daily. 12/02/15  Yes Historical Provider, MD  Red Yeast Rice 600 MG CAPS Take 1 capsule by mouth daily.   Yes Historical Provider, MD  dicyclomine (BENTYL) 10 MG capsule Take 10 mg by mouth 3 (three) times daily as needed  for spasms.    Historical Provider, MD  ondansetron (ZOFRAN) 8 MG tablet Take 1 tablet (8 mg total) by mouth every 8 (eight) hours as needed for nausea or vomiting. 06/07/16   Patrici Ranks, MD     Family History  Problem Relation Age of Onset  . Heart disease Brother     prior to age 69  . Stroke Brother   . Hypertension Brother   . Cancer Mother     ovarian  . Alcoholism Father   . Cancer Maternal Uncle     stomach  . Other Brother     MVA    Social History   Social History  . Marital status: Married    Spouse name: N/A  . Number of children: N/A  . Years of education: N/A   Social History Main Topics  . Smoking status: Former Smoker     Packs/day: 2.00    Years: 35.00    Quit date: 11/21/2004  . Smokeless tobacco: Never Used  . Alcohol use No  . Drug use: No  . Sexual activity: Not Asked     Comment: married- 2 sons   Other Topics Concern  . None   Social History Narrative  . None     Review of Systems: A 12 point ROS discussed and pertinent positives are indicated in the HPI above.  All other systems are negative.  Review of Systems  Constitutional: Positive for activity change, appetite change, fatigue and unexpected weight change. Negative for fever.  Respiratory: Negative for shortness of breath.   Cardiovascular: Positive for leg swelling. Negative for chest pain.  Gastrointestinal: Positive for nausea.  Musculoskeletal: Positive for back pain and gait problem.  Neurological: Positive for weakness.  Psychiatric/Behavioral: Negative for behavioral problems and confusion.    Vital Signs: BP 139/90   Pulse 95   Temp 98.5 F (36.9 C)   Resp 20   Ht 5\' 7"  (1.702 m)   Wt 208 lb (94.3 kg)   SpO2 96%   BMI 32.58 kg/m   Physical Exam  Constitutional: He is oriented to person, place, and time.  Cardiovascular: Normal rate and regular rhythm.   Pulmonary/Chest: Effort normal and breath sounds normal. He has no wheezes.  Abdominal: Soft. Bowel sounds are normal. There is tenderness.  Musculoskeletal: Normal range of motion. He exhibits no edema.  Neurological: He is alert and oriented to person, place, and time.  Skin: Skin is warm and dry.  Psychiatric: He has a normal mood and affect. His behavior is normal. Judgment and thought content normal.  Nursing note and vitals reviewed.   Mallampati Score:  MD Evaluation Airway: WNL Heart: WNL Abdomen: WNL Chest/ Lungs: WNL ASA  Classification: 3 Mallampati/Airway Score: One  Imaging: No results found.  Labs:  CBC:  Recent Labs  06/07/16 1748  WBC 10.4  HGB 13.0  HCT 40.0  PLT 194    COAGS: No results for input(s): INR, APTT in the  last 8760 hours.  BMP:  Recent Labs  06/07/16 1748  NA 131*  K 4.9  CL 97*  CO2 27  GLUCOSE 128*  BUN 20  CALCIUM 8.8*  CREATININE 0.93  GFRNONAA >60  GFRAA >60    LIVER FUNCTION TESTS:  Recent Labs  06/07/16 1748  BILITOT 0.5  AST 31  ALT 23  ALKPHOS 154*  PROT 7.8  ALBUMIN 3.8    TUMOR MARKERS:  Recent Labs  06/07/16 1748  CA199 75,141*    Assessment  and Plan:  Presumed pancreatic cancer per imaging Liver and peritoneal metastasis Scheduled for biopsy per Dr Whitney Muse for tissue diagnosis and treatment Risks and Benefits discussed with the patient including, but not limited to bleeding, infection, damage to adjacent structures or low yield requiring additional tests. All of the patient's questions were answered, patient is agreeable to proceed. Consent signed and in chart.   Thank you for this interesting consult.  I greatly enjoyed meeting FIRAS REEM and look forward to participating in their care.  A copy of this report was sent to the requesting provider on this date.  Electronically Signed: Antinio Sanderfer A 06/14/2016, 12:45 PM   I spent a total of  30 Minutes   in face to face in clinical consultation, greater than 50% of which was counseling/coordinating care for liver lesion biopsy

## 2016-06-14 NOTE — Sedation Documentation (Signed)
Report given to SS RN 

## 2016-06-15 ENCOUNTER — Ambulatory Visit (HOSPITAL_COMMUNITY): Payer: Medicare PPO

## 2016-06-20 NOTE — Progress Notes (Signed)
[place]  Progress Note  Patient Care Team: Glenda Chroman, MD as PCP - General (Internal Medicine) Dian Situ, MD (Pain Medicine)  CHIEF COMPLAINTS/PURPOSE OF CONSULTATION:  Abdominal pain Pancreatic mass Liver mass Lymphadenopathy Splenic mass Bilateral adrenal nodules Multiple pulmonary nodules    Pancreatic cancer metastasized to liver ([place])   06/07/2016 Tumor Marker    CA 19-9  75,141 U/ml      06/14/2016 Initial Biopsy    CT biopspy of R liver lesion, 3 18 gauge core biopsies obtained       06/14/2016 Pathology Results    Liver, needle/core biopsy METASTATIC ADENOCARCINOMA Microscopic Comment The adenocarcinoma with intra cellular mucin stains positive for ck7, focally positive for cdx2, ck20, negative for TTF-1, prostein, and pax8. The morphology and staining pattern favor the adenocarcinoma is pancreatobiliary origin.        HISTORY OF PRESENTING ILLNESS:  Jake Gonzalez 60 y.o. male is here for further discussion of stage IV adenocarcinoma of the pancreas. He has just had a biopsy and is here to review results and discuss treatment options.   Patient is accompanied by wife, son, and son's girlfriend.  No nausea and vomiting. Major complaint continues to be abdominal and back pain. He wishes to try therapy, understands that it is not curative.  Had no problems with his biopsy, no problems afterwards.  He has restarted his "blod thinner."   MEDICAL HISTORY:  Past Medical History:  Diagnosis Date  . CAD (coronary artery disease)   . Clotting disorder ([place])   . COPD (chronic obstructive pulmonary disease) ([place])   . Diabetes mellitus without complication ([place])   . High cholesterol   . Hypertension   . Myocardial infarction   . Pancreatic cancer [facility])     SURGICAL HISTORY: Past Surgical History:  Procedure Laterality Date  . BACK SURGERY    . hemilaminectomy  2003  . HERNIA REPAIR    . LUMBAR FUSION  1997   L4-5 X2  .  TONSILLECTOMY      SOCIAL HISTORY: Social History   Social History  . Marital status: Married    Spouse name: N/A  . Number of children: N/A  . Years of education: N/A   Occupational History  . Not on file.   Social History Main Topics  . Smoking status: Former Smoker    Packs/day: 2.00    Years: 35.00    Quit date: 11/21/2004  . Smokeless tobacco: Never Used  . Alcohol use No  . Drug use: No  . Sexual activity: Not on file     Comment: married- 2 sons   Other Topics Concern  . Not on file   Social History Narrative  . No narrative on file  Jake Gonzalez's been married since 2001 to his second wife. He has two children. 4 great grandchildren.  He worked in [employer] fund[employer].  Patient does not consume alcohol.  Patient quit smoking Feb. 6, 2006  FAMILY HISTORY: Family History  Problem Relation Age of Onset  . Heart disease Brother     prior to age 35  . Stroke Brother   . Hypertension Brother   . Cancer Mother     ovarian  . Alcoholism Father   . Cancer Maternal Uncle     stomach  . Other Brother     MVA  Mother had ovarian cancer and stomach cancer; deceased at 6 years old Uncle possibly had stomach cancer Father was an alcoholic and died at 66  years old. Most of father's family was alcoholic. Brother had heart attack  ALLERGIES:  has No Known Allergies.  MEDICATIONS:  Current Outpatient Prescriptions  Medication Sig Dispense Refill  . apixaban (ELIQUIS) 5 MG TABS tablet Take 5 mg by mouth at bedtime.    Marland Kitchen atenolol (TENORMIN) 100 MG tablet Take 1 tablet by mouth daily.    Marland Kitchen atorvastatin (LIPITOR) 10 MG tablet     . cilostazol (PLETAL) 100 MG tablet Take 1 tablet (100 mg total) by mouth 2 (two) times daily before a meal. 60 tablet 0  . dicyclomine (BENTYL) 10 MG capsule Take 10 mg by mouth 3 (three) times daily as needed for spasms.    . Gemcitabine HCl (GEMZAR IV) Inject into the vein.    Marland Kitchen gemfibrozil (LOPID) 600 MG tablet Take 1 tablet by mouth daily.    Marland Kitchen  glimepiride (AMARYL) 4 MG tablet Take 1 tablet by mouth 2 (two) times daily.    Marland Kitchen lisinopril (PRINIVIL,ZESTRIL) 20 MG tablet Take 1 tablet by mouth daily.    Marland Kitchen LORazepam (ATIVAN) 0.5 MG tablet Take 1 tablet (0.5 mg total) by mouth every 4 (four) hours as needed for anxiety (and nausea). 60 tablet 0  . metFORMIN (GLUCOPHAGE) 500 MG tablet Take 1 tablet by mouth. 2 tablets every morning and 1 tablet every evening    . morphine (MS CONTIN) 15 MG 12 hr tablet Take 15 mg by mouth every 4 (four) hours as needed for pain.    Marland Kitchen morphine (MS CONTIN) 15 MG 12 hr tablet Take one 15 mg tab and one 30 mg tab to equal 45 mg every 12 hours. 60 tablet 0  . morphine (MS CONTIN) 30 MG 12 hr tablet Take one 15 mg tab and one 30 mg tab to equal 45 mg every 12 hours 60 tablet 0  . ondansetron (ZOFRAN) 8 MG tablet Take 1 tablet (8 mg total) by mouth every 8 (eight) hours as needed for nausea or vomiting. 30 tablet 1  . PACLitaxel Protein-Bound Part (ABRAXANE IV) Inject into the vein.    . pioglitazone (ACTOS) 15 MG tablet Take 1 tablet by mouth daily.    . Red Yeast Rice 600 MG CAPS Take 1 capsule by mouth daily.    . fentaNYL (DURAGESIC - DOSED MCG/HR) 50 MCG/HR     . ondansetron (ZOFRAN) 8 MG tablet Take 1 tablet (8 mg total) by mouth 2 (two) times daily as needed (Nausea or vomiting). 30 tablet 1  . prochlorperazine (COMPAZINE) 10 MG tablet TAKE (1) TABLET EVERY SIX HOURS AS NEEDED FOR NAUSEA AND VOMITING. 30 tablet 0   No current facility-administered medications for this visit.     Review of Systems  Constitutional: Positive for malaise/fatigue and weight loss.  HENT: Negative.  Negative for ear pain, hearing loss and tinnitus.   Eyes: Negative.   Respiratory: Negative.  Negative for cough, hemoptysis, sputum production, shortness of breath and wheezing.   Cardiovascular: Negative.  Negative for chest pain, palpitations, orthopnea, claudication, leg swelling and PND.  Gastrointestinal: Positive for abdominal  pain, constipation and nausea. Negative for blood in stool, heartburn, melena and vomiting.  Genitourinary: Negative.  Negative for dysuria, flank pain, frequency, hematuria and urgency.  Musculoskeletal: Positive for back pain and joint pain.  Skin: Negative.   Neurological: Positive for tingling and weakness. Negative for headaches.       Tingling in hands and feet  Endo/Heme/Allergies: Negative.   Psychiatric/Behavioral: Negative for depression, hallucinations, memory loss, substance  abuse and suicidal ideas. The patient is nervous/anxious. The patient does not have insomnia.   All other systems reviewed and are negative.  14 point ROS was done and is otherwise as detailed above or in HPI   PHYSICAL EXAMINATION: ECOG PERFORMANCE STATUS: 1 - Symptomatic but completely ambulatory  Vitals:   06/21/16 1637  BP: 126/67  Pulse: 81  Resp: 18  Temp: 97.8 F (36.6 C)   Filed Weights   06/21/16 1637  Weight: 204 lb 12.8 oz (92.9 kg)     Physical Exam  Constitutional: He is oriented to person, place, and time and well-developed, well-nourished, and in no distress.  HENT:  Head: Normocephalic and atraumatic.  Nose: Nose normal.  Mouth/Throat: Oropharynx is clear and moist. No oropharyngeal exudate.  Eyes: Conjunctivae and EOM are normal. Pupils are equal, round, and reactive to light. Right eye exhibits no discharge. Left eye exhibits no discharge. No scleral icterus.  Neck: Normal range of motion. Neck supple. No tracheal deviation present. No thyromegaly present.  Cardiovascular: Normal rate, regular rhythm and normal heart sounds.  Exam reveals no gallop and no friction rub.   No murmur heard. Left leg swollen due to blood clot  Pulmonary/Chest: Effort normal and breath sounds normal. He has no wheezes. He has no rales.  Abdominal: Soft. Bowel sounds are normal. He exhibits no distension and no mass. There is tenderness. There is no rebound and no guarding.  Musculoskeletal:  Normal range of motion. He exhibits edema.  LLE > RLE  Lymphadenopathy:    He has no cervical adenopathy.  Neurological: He is alert and oriented to person, place, and time. He has normal reflexes. No cranial nerve deficit. Gait normal. Coordination normal.  Skin: Skin is warm and dry. No rash noted.  Psychiatric: Mood, memory, affect and judgment normal.  Nursing note and vitals reviewed.   LABORATORY DATA:  I have reviewed the data as listed Results for ROBRT, NESLER (MRN [id number])   Ref. Range 06/14/2016 10:50  WBC Latest Ref Range: 4.0 - 10.5 K/uL 9.1  RBC Latest Ref Range: 4.22 - 5.81 MIL/uL 4.35  Hemoglobin Latest Ref Range: 13.0 - 17.0 g/dL 12.1 (L)  HCT Latest Ref Range: 39.0 - 52.0 % 38.3 (L)  MCV Latest Ref Range: 78.0 - 100.0 fL 88.0  MCH Latest Ref Range: 26.0 - 34.0 pg 27.8  MCHC Latest Ref Range: 30.0 - 36.0 g/dL 31.6  RDW Latest Ref Range: 11.5 - 15.5 % 13.3  Platelets Latest Ref Range: 150 - 400 K/uL 196   Results for CLAIBORN, MCCALIP (MRN [id number])   Ref. Range 06/07/2016 17:48  CA 19-9 Latest Ref Range: 0 - 35 U/mL 75,141 (H)    RADIOGRAPHIC STUDIES: I have personally reviewed the radiological images as listed and agreed with the findings in the report. No results found.  PATHOLOGY   ASSESSMENT & PLAN: Stage IV adenocarcinoma of the pancreas Liver metastases Pulmonary metastases Abdominal/Back pain Lymphadenopathy Splenic mass Bilateral adrenal nodules Weight loss LLE DVT Chronic Anticoagulation HX chronic back pain Anxiety  Cancer related pain  We reviewed his pathology in detail. We discussed his staging, stage IV. I explained that this is not curable. He notes that he understands what this means. We reviewed options for therapy. He is most appropriate for Gemzar/Abraxane. We will start with a PICC line and if therapy is tolerated proceed with port a cath placement. He is agreeable with this plan.  Chemotherapy Counseling: I discussed the  risks and benefits  of chemotherapy including the risks of nausea/ vomiting, risk of infection from low WBC count, fatigue due to chemo or anemia, bruising or bleeding due to low platelets, mouth sores, loss/ change in taste and decreased appetite. Liver and kidney function will be monitored through out chemotherapy as abnormalities in liver and kidney function may be a side effect of treatment.   His pain medications are currently managed through a pain clinic.  I have already discussed with the patient that we can take over his pain medication prescribing.   I told the patient to keep taking bentyl, since it seems to be helping his abdominal cramping.Anderson Malta is present for our conversation today, she will arrange for chemotherapy teaching and set a date to start therapy.   He will return C1D8 to assess tolerance with repeat PE, labs and ongoing therapy.   ORDERS PLACED FOR THIS ENCOUNTER: Orders Placed This Encounter  Procedures  . CBC with Differential  . Comprehensive metabolic panel  . Cancer antigen 19-9    MEDICATIONS PRESCRIBED THIS ENCOUNTER: Meds ordered this encounter  Medications  . fentaNYL (DURAGESIC - DOSED MCG/HR) 50 MCG/HR  . Gemcitabine HCl (GEMZAR IV)    Sig: Inject into the vein.  Marland Kitchen PACLitaxel Protein-Bound Part (ABRAXANE IV)    Sig: Inject into the vein.    All questions were answered. The patient knows to call the clinic with any problems, questions or concerns.  This document serves as a record of services personally performed by Ancil Linsey, MD. It was created on her behalf by Arlyce Harman, a trained medical scribe. The creation of this record is based on the scribe's personal observations and the provider's statements to them. This document has been checked and approved by the attending provider.  I have reviewed the above documentation for accuracy and completeness, and I agree with the above.  This note was electronically signed.    Molli Hazard, MD   06/26/2016 5:19 PM

## 2016-06-21 ENCOUNTER — Encounter (HOSPITAL_BASED_OUTPATIENT_CLINIC_OR_DEPARTMENT_OTHER): Payer: Medicare PPO | Admitting: Hematology & Oncology

## 2016-06-21 ENCOUNTER — Encounter (HOSPITAL_COMMUNITY): Payer: Self-pay | Admitting: Hematology & Oncology

## 2016-06-21 VITALS — BP 126/67 | HR 81 | Temp 97.8°F | Resp 18 | Wt 204.8 lb

## 2016-06-21 DIAGNOSIS — K8689 Other specified diseases of pancreas: Secondary | ICD-10-CM

## 2016-06-21 DIAGNOSIS — R109 Unspecified abdominal pain: Secondary | ICD-10-CM | POA: Diagnosis not present

## 2016-06-21 DIAGNOSIS — G8929 Other chronic pain: Secondary | ICD-10-CM

## 2016-06-21 DIAGNOSIS — C259 Malignant neoplasm of pancreas, unspecified: Secondary | ICD-10-CM | POA: Diagnosis not present

## 2016-06-21 DIAGNOSIS — F418 Other specified anxiety disorders: Secondary | ICD-10-CM

## 2016-06-21 DIAGNOSIS — C787 Secondary malignant neoplasm of liver and intrahepatic bile duct: Secondary | ICD-10-CM

## 2016-06-21 DIAGNOSIS — I825Z2 Chronic embolism and thrombosis of unspecified deep veins of left distal lower extremity: Secondary | ICD-10-CM

## 2016-06-21 DIAGNOSIS — G893 Neoplasm related pain (acute) (chronic): Secondary | ICD-10-CM

## 2016-06-21 NOTE — Progress Notes (Signed)
Set pt up for chemo teaching and first chemotherapy appt.  Explained to pt and family all of this.  They verbalized understanding.

## 2016-06-21 NOTE — Patient Instructions (Signed)
[facility] at [facility] Discharge Instructions  RECOMMENDATIONS MADE BY THE CONSULTANT AND ANY TEST RESULTS WILL BE SENT TO YOUR REFERRING PHYSICIAN.  You saw Dr. Penland today.  Thank you for choosing [facility] at [facility] to provide your oncology and hematology care.  To afford each patient quality time with our provider, please arrive at least 15 minutes before your scheduled appointment time.   Beginning January 23rd 2017 lab work for the Cancer Center will be done in the  Main lab at [place] on 1st floor. If you have a lab appointment with the Cancer Center please come in thru the  Main Entrance and check in at the main information desk  You need to re-schedule your appointment should you arrive 10 or more minutes late.  We strive to give you quality time with our providers, and arriving late affects you and other patients whose appointments are after yours.  Also, if you no show three or more times for appointments you may be dismissed from the clinic at the providers discretion.     Again, thank you for choosing [facility].  Our hope is that these requests will decrease the amount of time that you wait before being seen by our physicians.       _____________________________________________________________  Should you have questions after your visit to [facility], please contact our office at [phone] between the hours of 8:30 a.m. and 4:30 p.m.  Voicemails left after 4:30 p.m. will not be returned until the following business day.  For prescription refill requests, have your pharmacy contact our office.         Resources For Cancer Patients and their Caregivers ? American Cancer Society: Can assist with transportation, wigs, general needs, runs Look Good Feel Better.        1-888-227-6333 ? Cancer Care: Provides financial assistance, online support groups, medication/co-pay assistance.   1-800-813-HOPE (4673) ? [facility] Assists [place] cancer patients and their families through emotional , educational and financial support.  336-427-4357 ? [place] DSS Where to apply for food stamps, Medicaid and utility assistance. 336-342-1394 ? [facility]: Transportation to medical appointments. 336-347-2287 ? Social Security Administration: May apply for disability if have a Stage IV cancer. 336-342-7796 1-800-772-1213 ? [place] Aging, Disability and Transit Services: Assists with nutrition, care and transit needs. 336-349-2343  Cancer Center Support Programs: @[email or web address]@ > Cancer Support Group  2nd Tuesday of the month 1pm-2pm, Journey Room  > Creative Journey  3rd Tuesday of the month 1130am-1pm, Journey Room  > Look Good Feel Better  1st Wednesday of the month 10am-12 noon, Journey Room (Call American Cancer Society to register 1-800-395-5775)    

## 2016-06-21 NOTE — Progress Notes (Signed)
START ON PATHWAY REGIMEN - Pancreatic  PANOS51: Nab-Paclitaxel (Abraxane) 125 mg/m2 D1, 8, 15 + Gemcitabine 1,000 mg/m2 D1, 8, 15 q28 Days Until Progression or Toxicity   A cycle is every 28 days:     Nab-Paclitaxel (protein bound) (Abraxane(R)) 125 mg/m2 in NS to a concentration of 5 mg/mL given IV over 30 mins days 1, 8, and 15 followed by Dose Mod: None     Gemcitabine (Gemzar(R)) 1000 mg/m2 in 250 mL NS IV over 30 minutes days 1, 8, and 15. Dose Mod: None Additional Orders: Hold therapy and notify physician if [place] < 1500.  **Always confirm dose/schedule in your pharmacy ordering system**    Patient Characteristics: Adenocarcinoma, Metastatic Disease, First Line, PS = 0, 1 Current evidence of distant metastases? Yes AJCC T Stage: X AJCC N Stage: X AJCC Stage Grouping: IV AJCC M Stage: X Histology: Adenocarcinoma Line of therapy: First Line Would you be surprised if this patient died  in the next year? I would NOT be surprised if this patient died in the next year  Intent of Therapy: Non-Curative / Palliative Intent, Discussed with Patient

## 2016-06-22 ENCOUNTER — Other Ambulatory Visit (HOSPITAL_COMMUNITY): Payer: Self-pay | Admitting: Hematology & Oncology

## 2016-06-22 DIAGNOSIS — C259 Malignant neoplasm of pancreas, unspecified: Secondary | ICD-10-CM

## 2016-06-22 DIAGNOSIS — C787 Secondary malignant neoplasm of liver and intrahepatic bile duct: Principal | ICD-10-CM

## 2016-06-22 MED ORDER — PROCHLORPERAZINE MALEATE 10 MG PO TABS: 10.0000 mg | ORAL_TABLET | Freq: Four times a day (QID) | ORAL | 1 refills | Status: DC | PRN

## 2016-06-22 MED ORDER — ONDANSETRON HCL 8 MG PO TABS: 8.0000 mg | ORAL_TABLET | Freq: Two times a day (BID) | ORAL | 1 refills | Status: DC | PRN

## 2016-06-22 NOTE — Patient Instructions (Signed)
[place]- Compazine- nausea medication given before chemotherapy starts  Gemcitabine (Gemzar)- bone marrow suppression (lowers white blood cells (fight infection), lowers red blood cells (make up your blood), lowers platelets (help blood to clot). Nausea/vomiting,fever, flu-like symptoms, rash. (takes 30 minutes to infuse)  Abraxane - myelosuppression (bone marrow suppression - lowers white blood cells, red blood cells, and platelets), sensory neuropathy, muscle and joint aches/pain, nausea/vomiting, diarrhea, mucositis, hair loss )   (takes 30 minutes to infuse)   EDUCATIONAL MATERIALS GIVEN AND REVIEWED: Chemotherapy and you book given, nutrition book given, gemzar and abraxane information     SELF CARE ACTIVITIES WHILE ON CHEMOTHERAPY:  Hydration  Increase your fluid intake 48 hours prior to treatment and drink at least 8 to 12 cups (64 ounces) of water/decaff beverages per day after treatment. You can still have your cup of coffee or soda but these beverages do not count as part of your 8 to 12 cups that you need to drink daily. No alcohol intake.  Medications  Continue taking your normal prescription medication as prescribed.  If you start any new herbal or new supplements please let us know first to make sure it is safe.  Mouth Care  Have teeth cleaned professionally before starting treatment. Keep dentures and partial plates clean. Use soft toothbrush and do not use mouthwashes that contain alcohol. Biotene is a good mouthwash that is available at most pharmacies or may be ordered by calling [phone]. Use warm salt water gargles (1 teaspoon salt per 1 quart warm water) before and after meals and at bedtime. Or you may rinse with 2 tablespoons of three-percent hydrogen peroxide mixed in eight ounces of water. If you are still having problems with your mouth or sores in your mouth please call the clinic. If you need dental  work, please let Dr. Whitney Muse know before you go for your appointment so that we can coordinate the best possible time for you in regards to your chemo regimen. You need to also let your dentist know that you are actively taking chemo. We may need to do labs prior to your dental appointment.   Skin Care  Always use sunscreen that has not expired and with SPF (Sun Protection Factor) of 50 or higher. Wear hats to protect your head from the sun. Remember to use sunscreen on your hands, ears, face, & feet.  Use good moisturizing lotions such as udder cream, eucerin, or even Vaseline. Some chemotherapies can cause dry skin, color changes in your skin and nails.    . Avoid long, hot showers or baths. . Use gentle, fragrance-free soaps and laundry detergent. . Use moisturizers, preferably creams or ointments rather than lotions because the thicker consistency is better at preventing skin dehydration. Apply the cream or ointment within 15 minutes of showering. Reapply moisturizer at night, and moisturize your hands every time after you wash them. Hair Loss (if your doctor says your hair will fall out)  . If your doctor says that your hair is likely to fall out, decide before you begin chemo whether you want to wear a wig. You may want to shop before treatment to match your hair color. . Hats, turbans, and scarves can also camouflage hair loss, although some people prefer to leave their heads uncovered. If you go bare-headed outdoors, be sure to use sunscreen on your scalp. . Cut your hair short. It eases the inconvenience of shedding lots of hair, but it also  can reduce the emotional impact of watching your hair fall out. . Don't perm or color your hair during chemotherapy. Those chemical treatments are already damaging to hair and can enhance hair loss. Once your chemo treatments are done and your hair has grown back, it's OK to resume dyeing or perming hair. With chemotherapy, hair loss is almost always  temporary. But when it grows back, it may be a different color or texture. In older adults who still had hair color before chemotherapy, the new growth may be completely gray.  Often, new hair is very fine and soft.  Infection Prevention  Please wash your hands for at least 30 seconds using warm soapy water. Handwashing is the #1 way to prevent the spread of germs. Stay away from sick people or people who are getting over a cold. If you develop respiratory systems such as green/yellow mucus production or productive cough or persistent cough let us know and we will see if you need an antibiotic. It is a good idea to keep a pair of gloves on when going into grocery stores/[employer] to decrease your risk of coming into contact with germs on the carts, etc. Carry alcohol hand gel with you at all times and use it frequently if out in public. If your temperature reaches 100.5 or higher please call the clinic and let us know.  If it is after hours or on the weekend please go to the ER if your temperature is over 100.5.  Please have your own personal thermometer at home to use.    Sex and bodily fluids  If you are going to have sex, a condom must be used to protect the person that isn't taking chemotherapy. Chemo can decrease your libido (sex drive). For a few days after chemotherapy, chemotherapy can be excreted through your bodily fluids.  When using the toilet please close the lid and flush the toilet twice.  Do this for a few day after you have had chemotherapy.   Animals If you have cats or birds we just ask that you not change the litter or change the cage.  Please have someone else do this for you while you are on chemotherapy.   Food Safety During and After Cancer Treatment  Food safety is important for people both during and after cancer treatment. Cancer and cancer treatments, such as chemotherapy, radiation therapy, and stem cell/bone marrow transplantation, often weaken the immune system. This makes  it harder for your body to protect itself from foodborne illness, also called food poisoning. Foodborne illness is caused by eating food that contains harmful bacteria, parasites, or viruses. Foods to avoid Some foods have a higher risk of becoming tainted with bacteria. These include: Marland Kitchen Unwashed fresh fruit and vegetables, especially leafy vegetables that can hide dirt and other contaminants . Raw sprouts, such as alfalfa sprouts . Raw or undercooked beef, especially ground beef, or other raw or undercooked meat and poultry . Cold hot dogs or deli lunch meat (cold cuts), including dry-cured, uncooked salami. Always cook or reheat these foods until they are steaming hot. . Fatty, fried, or spicy foods immediately before or after treatment.  These can sit heavy on your stomach and make you feel nauseous. . Raw or undercooked shellfish, such as oysters. . Sushi and sashimi, which often contain raw fish.  . Unpasteurized beverages, such as unpasteurized fruit juices, raw milk, raw yogurt, or cider . Soft cheeses made from unpasteurized milk, such as blue-veined (a type of blue cheese),  Brie, Camembert, feta, goat cheese, and queso fresco or blanco . Undercooked eggs, such as soft boiled, over easy, and poached; raw, unpasteurized eggs; or foods made with raw egg, such as homemade raw cookie dough and homemade mayonnaise . Deli-prepared salads with egg, ham, chicken, or seafood Simple steps for food safety Shop smart. . Do not buy food stored or displayed in an unclean area. . Do not buy bruised or damaged fruits or vegetables. . Do not buy cans that have cracks, dents, or bulges. . Pick up foods that can spoil at the end of your shopping trip and store them in a cooler on the way home. Prepare and clean up foods carefully. . Rinse all fresh fruits and vegetables under running water, and dry them with a clean towel or paper towel. . Clean the top of cans before opening them. . After preparing  food, wash your hands for 20 seconds with hot water and soap. Pay special attention to areas between fingers and under nails. . Clean your utensils and dishes with hot water and soap. Marland Kitchen Disinfect your kitchen and cutting boards using 1 teaspoon of liquid, unscented bleach mixed into 1 quart of water.   Prevent cross-contamination. Marland Kitchen Keep raw meat, poultry, and fish or their juices away from other food. Bacteria can spread through contact with the food or its liquid, causing cross-contamination. . Do not rinse raw meat or poultry because it can spread bacteria to nearby surfaces. Wendee Copp all items you used for preparing raw foods, including utensils, cutting board, and plates, before using them for other foods or cooked meat. . Set aside a specific cutting board for preparing uncooked meat, fish, and chicken. Never use it for uncooked fruits, vegetables, or other foods. Dispose of old food. . Eat canned and packaged food before its expiration date (the "use by" or "best before" date). . Consume refrigerated leftovers within 3 to 4 days. After that time, throw out the food. Even if the food does not smell or look spoiled, it still may be unsafe. Some bacteria, such as Listeria, can grow even on foods stored in the refrigerator if they are kept for too long. Take precautions when eating out. . At restaurants, avoid buffets and salad bars where food sits out for a long time and comes in contact with many people. Food can become contaminated when someone with a virus, often a norovirus, or another "bug" handles it. . Put any leftover food in a "to-go" container yourself, rather than having the server do it. And, refrigerate leftovers as soon as you get home. . Choose restaurants that are clean and that are willing to prepare your food as you order it cooked. Cook food to the right temperature. Use a food thermometer to check for a safe internal temperature of all poultry and meat. For instance, a hamburger  should be cooked to at least medium (160?F or 71?C). Get a full list of recommended internal cooking temperatures on the website of the U.S. Food and Drug Administration (FDA).  Chill food promptly. Refrigerate or freeze perishable food within 2 hours of cooking or buying it (sooner in warm weather.) Proper cooking destroys bacteria, but they can still grow on cooked food that is left out too long. Food stored in the refrigerator should be kept at below 40?F (4?C). And, food stored in the freezer should be kept below 32?F (0?C). Thaw food properly. Thaw frozen food in the refrigerator rather than at room temperature. You can also  thaw food in frequently changed cold water or in the microwave, but cook it as soon as it thaws.      MEDICATIONS:  Zofran/Ondansetron 8mg  tablet. Take 1 tablet every 8 hours as needed for nausea/vomiting. (#1 nausea med to take, this can constipate)  Compazine/Prochlorperazine 10mg  tablet. Take 1 tablet every 6 hours as needed for nausea/vomiting. (#2 nausea med to take, this can make you sleepy)   Over-the-Counter Meds:  Miralax 1 capful in 8 oz of fluid daily. May increase to two times a day if needed. This is a stool softener. If this doesn't work proceed you can add:  Senokot S  - start with 1 tablet two times a day and increase to 4 tablets two times a day if needed. (total of 8 tablets in a 24 hour period). This is a stimulant laxative.   Call us if this does not help your bowels move.   Imodium 2mg  capsule. Take 2 capsules after the 1st loose stool and then 1 capsule every 2 hours until you go a total of 12 hours without having a loose stool. Call the [facility] if loose stools continue. If diarrhea occurs @ bedtime, take 2 capsules @ bedtime. Then take 2 capsules every 4 hours until morning. Call [facility].   SYMPTOMS TO REPORT AS SOON AS POSSIBLE AFTER TREATMENT:  FEVER GREATER THAN 100.5 F  CHILLS WITH OR WITHOUT FEVER  NAUSEA AND VOMITING  THAT IS NOT CONTROLLED WITH YOUR NAUSEA MEDICATION  UNUSUAL SHORTNESS OF BREATH  UNUSUAL BRUISING OR BLEEDING  TENDERNESS IN MOUTH AND THROAT WITH OR WITHOUT PRESENCE OF ULCERS  URINARY PROBLEMS  BOWEL PROBLEMS  UNUSUAL RASH    Wear comfortable clothing and clothing appropriate for easy access to any Portacath or PICC line. Let us know if there is anything that we can do to make your therapy better!      I have been informed and understand all of the instructions given to me and have received a copy. I have been instructed to call the clinic [phone])  or my family physician as soon as possible for continued medical care, if indicated. I do not have any more questions at this time but understand that I may call the [facility] at ([phone]) during office hours should I have questions or need assistance in obtaining follow-up care.      Gemcitabine injection What is this medicine? GEMCITABINE (jem SIT a been) is a chemotherapy drug. This medicine is used to treat many types of cancer like breast cancer, lung cancer, pancreatic cancer, and ovarian cancer. This medicine may be used for other purposes; ask your health care provider or pharmacist if you have questions. What should I tell my health care provider before I take this medicine? They need to know if you have any of these conditions: -blood disorders -infection -kidney disease -liver disease -recent or ongoing radiation therapy -an unusual or allergic reaction to gemcitabine, other chemotherapy, other medicines, foods, dyes, or preservatives -pregnant or trying to get pregnant -breast-feeding How should I use this medicine? This drug is given as an infusion into a vein. It is administered in a hospital or clinic by a specially trained health care professional. Talk to your pediatrician regarding the use of this medicine in children. Special care may be needed. Overdosage: If you think you have taken too much of this medicine  contact a poison control center or emergency room at once. NOTE: This medicine is only for you. Do not  share this medicine with others. What if I miss a dose? It is important not to miss your dose. Call your doctor or health care professional if you are unable to keep an appointment. What may interact with this medicine? -medicines to increase blood counts like filgrastim, pegfilgrastim, sargramostim -some other chemotherapy drugs like cisplatin -vaccines Talk to your doctor or health care professional before taking any of these medicines: -acetaminophen -aspirin -ibuprofen -ketoprofen -naproxen This list may not describe all possible interactions. Give your health care provider a list of all the medicines, herbs, non-prescription drugs, or dietary supplements you use. Also tell them if you smoke, drink alcohol, or use illegal drugs. Some items may interact with your medicine. What should I watch for while using this medicine? Visit your doctor for checks on your progress. This drug may make you feel generally unwell. This is not uncommon, as chemotherapy can affect healthy cells as well as cancer cells. Report any side effects. Continue your course of treatment even though you feel ill unless your doctor tells you to stop. In some cases, you may be given additional medicines to help with side effects. Follow all directions for their use. Call your doctor or health care professional for advice if you get a fever, chills or sore throat, or other symptoms of a cold or flu. Do not treat yourself. This drug decreases your body's ability to fight infections. Try to avoid being around people who are sick. This medicine may increase your risk to bruise or bleed. Call your doctor or health care professional if you notice any unusual bleeding. Be careful brushing and flossing your teeth or using a toothpick because you may get an infection or bleed more easily. If you have any dental work done, tell your  dentist you are receiving this medicine. Avoid taking products that contain aspirin, acetaminophen, ibuprofen, naproxen, or ketoprofen unless instructed by your doctor. These medicines may hide a fever. Women should inform their doctor if they wish to become pregnant or think they might be pregnant. There is a potential for serious side effects to an unborn child. Talk to your health care professional or pharmacist for more information. Do not breast-feed an infant while taking this medicine. What side effects may I notice from receiving this medicine? Side effects that you should report to your doctor or health care professional as soon as possible: -allergic reactions like skin rash, itching or hives, swelling of the face, lips, or tongue -low blood counts - this medicine may decrease the number of white blood cells, red blood cells and platelets. You may be at increased risk for infections and bleeding. -signs of infection - fever or chills, cough, sore throat, pain or difficulty passing urine -signs of decreased platelets or bleeding - bruising, pinpoint red spots on the skin, black, tarry stools, blood in the urine -signs of decreased red blood cells - unusually weak or tired, fainting spells, lightheadedness -breathing problems -chest pain -mouth sores -nausea and vomiting -pain, swelling, redness at site where injected -pain, tingling, numbness in the hands or feet -stomach pain -swelling of ankles, feet, hands -unusual bleeding Side effects that usually do not require medical attention (report to your doctor or health care professional if they continue or are bothersome): -constipation -diarrhea -hair loss -loss of appetite -stomach upset This list may not describe all possible side effects. Call your doctor for medical advice about side effects. You may report side effects to FDA at 1-800-FDA-1088. Where should I keep my medicine?  This drug is given in a hospital or clinic and  will not be stored at home. NOTE: This sheet is a summary. It may not cover all possible information. If you have questions about this medicine, talk to your doctor, pharmacist, or health care provider.    2016, Elsevier/Gold Standard. (2008-01-22 18:45:54)      Nanoparticle Albumin-Bound Paclitaxel injection What is this medicine? NANOPARTICLE ALBUMIN-BOUND PACLITAXEL (Na no PAHR ti kuhl al BYOO muhn-bound PAK li TAX el) is a chemotherapy drug. It targets fast dividing cells, like cancer cells, and causes these cells to die. This medicine is used to treat advanced breast cancer and advanced lung cancer. This medicine may be used for other purposes; ask your health care provider or pharmacist if you have questions. What should I tell my health care provider before I take this medicine? They need to know if you have any of these conditions: -kidney disease -liver disease -low blood counts, like low platelets, red blood cells, or white blood cells -recent or ongoing radiation therapy -an unusual or allergic reaction to paclitaxel, albumin, other chemotherapy, other medicines, foods, dyes, or preservatives -pregnant or trying to get pregnant -breast-feeding How should I use this medicine? This drug is given as an infusion into a vein. It is administered in a hospital or clinic by a specially trained health care professional. Talk to your pediatrician regarding the use of this medicine in children. Special care may be needed. Overdosage: If you think you have taken too much of this medicine contact a poison control center or emergency room at once. NOTE: This medicine is only for you. Do not share this medicine with others. What if I miss a dose? It is important not to miss your dose. Call your doctor or health care professional if you are unable to keep an appointment. What may interact with this medicine? -cyclosporine -diazepam -ketoconazole -medicines to increase blood counts like  filgrastim, pegfilgrastim, sargramostim -other chemotherapy drugs like cisplatin, doxorubicin, epirubicin, etoposide, teniposide, vincristine -quinidine -testosterone -vaccines -verapamil Talk to your doctor or health care professional before taking any of these medicines: -acetaminophen -aspirin -ibuprofen -ketoprofen -naproxen This list may not describe all possible interactions. Give your health care provider a list of all the medicines, herbs, non-prescription drugs, or dietary supplements you use. Also tell them if you smoke, drink alcohol, or use illegal drugs. Some items may interact with your medicine. What should I watch for while using this medicine? Your condition will be monitored carefully while you are receiving this medicine. You will need important blood work done while you are taking this medicine. This drug may make you feel generally unwell. This is not uncommon, as chemotherapy can affect healthy cells as well as cancer cells. Report any side effects. Continue your course of treatment even though you feel ill unless your doctor tells you to stop. In some cases, you may be given additional medicines to help with side effects. Follow all directions for their use. Call your doctor or health care professional for advice if you get a fever, chills or sore throat, or other symptoms of a cold or flu. Do not treat yourself. This drug decreases your body's ability to fight infections. Try to avoid being around people who are sick. This medicine may increase your risk to bruise or bleed. Call your doctor or health care professional if you notice any unusual bleeding. Be careful brushing and flossing your teeth or using a toothpick because you may get an infection or bleed  more easily. If you have any dental work done, tell your dentist you are receiving this medicine. Avoid taking products that contain aspirin, acetaminophen, ibuprofen, naproxen, or ketoprofen unless instructed by your  doctor. These medicines may hide a fever. Do not become pregnant while taking this medicine. Women should inform their doctor if they wish to become pregnant or think they might be pregnant. There is a potential for serious side effects to an unborn child. Talk to your health care professional or pharmacist for more information. Do not breast-feed an infant while taking this medicine. Men are advised not to father a child while receiving this medicine. What side effects may I notice from receiving this medicine? Side effects that you should report to your doctor or health care professional as soon as possible: -allergic reactions like skin rash, itching or hives, swelling of the face, lips, or tongue -low blood counts - This drug may decrease the number of white blood cells, red blood cells and platelets. You may be at increased risk for infections and bleeding. -signs of infection - fever or chills, cough, sore throat, pain or difficulty passing urine -signs of decreased platelets or bleeding - bruising, pinpoint red spots on the skin, black, tarry stools, nosebleeds -signs of decreased red blood cells - unusually weak or tired, fainting spells, lightheadedness -breathing problems -changes in vision -chest pain -high or low blood pressure -mouth sores -nausea and vomiting -pain, swelling, redness or irritation at the injection site -pain, tingling, numbness in the hands or feet -slow or irregular heartbeat -swelling of the ankle, feet, hands Side effects that usually do not require medical attention (report to your doctor or health care professional if they continue or are bothersome): -aches, pains -changes in the color of fingernails -diarrhea -hair loss -loss of appetite This list may not describe all possible side effects. Call your doctor for medical advice about side effects. You may report side effects to FDA at 1-800-FDA-1088. Where should I keep my medicine? This drug is given  in a hospital or clinic and will not be stored at home. NOTE: This sheet is a summary. It may not cover all possible information. If you have questions about this medicine, talk to your doctor, pharmacist, or health care provider.    2016, Elsevier/Gold Standard. (2012-11-05 16:48:50)

## 2016-06-23 ENCOUNTER — Encounter (HOSPITAL_COMMUNITY): Payer: Medicare PPO

## 2016-06-23 DIAGNOSIS — C259 Malignant neoplasm of pancreas, unspecified: Secondary | ICD-10-CM

## 2016-06-23 DIAGNOSIS — C787 Secondary malignant neoplasm of liver and intrahepatic bile duct: Principal | ICD-10-CM

## 2016-06-24 ENCOUNTER — Encounter (HOSPITAL_BASED_OUTPATIENT_CLINIC_OR_DEPARTMENT_OTHER): Payer: Medicare PPO

## 2016-06-24 ENCOUNTER — Encounter (HOSPITAL_COMMUNITY): Payer: Medicare PPO

## 2016-06-24 VITALS — BP 132/60 | HR 82 | Temp 98.1°F | Resp 20 | Wt 204.8 lb

## 2016-06-24 DIAGNOSIS — Z5111 Encounter for antineoplastic chemotherapy: Secondary | ICD-10-CM | POA: Diagnosis not present

## 2016-06-24 DIAGNOSIS — K8689 Other specified diseases of pancreas: Secondary | ICD-10-CM

## 2016-06-24 DIAGNOSIS — C787 Secondary malignant neoplasm of liver and intrahepatic bile duct: Secondary | ICD-10-CM | POA: Diagnosis not present

## 2016-06-24 DIAGNOSIS — C259 Malignant neoplasm of pancreas, unspecified: Secondary | ICD-10-CM

## 2016-06-24 DIAGNOSIS — Z23 Encounter for immunization: Secondary | ICD-10-CM | POA: Diagnosis not present

## 2016-06-24 LAB — COMPREHENSIVE METABOLIC PANEL
ALBUMIN: 3.6 g/dL (ref 3.5–5.0)
ALK PHOS: 185 U/L — AB (ref 38–126)
ALT: 29 U/L (ref 17–63)
ANION GAP: 10 (ref 5–15)
AST: 34 U/L (ref 15–41)
BUN: 18 mg/dL (ref 6–20)
CALCIUM: 9.1 mg/dL (ref 8.9–10.3)
CHLORIDE: 96 mmol/L — AB (ref 101–111)
CO2: 27 mmol/L (ref 22–32)
Creatinine, Ser: 1.1 mg/dL (ref 0.61–1.24)
GFR calc non Af Amer: >60 mL/min (ref >60)
GLUCOSE: 201 mg/dL — AB (ref 65–99)
Potassium: 5.1 mmol/L (ref 3.5–5.1)
SODIUM: 133 mmol/L — AB (ref 135–145)
Total Bilirubin: 0.6 mg/dL (ref 0.3–1.2)
Total Protein: 7.5 g/dL (ref 6.5–8.1)

## 2016-06-24 LAB — CBC WITH DIFFERENTIAL/PLATELET
BASOS PCT: 1 %
Basophils Absolute: 0.1 10*3/uL (ref 0.0–0.1)
EOS ABS: 0.2 10*3/uL (ref 0.0–0.7)
EOS PCT: 1 %
HCT: 38.8 % — ABNORMAL LOW (ref 39.0–52.0)
HEMOGLOBIN: 12.4 g/dL — AB (ref 13.0–17.0)
Lymphocytes Relative: 14 %
Lymphs Abs: 1.7 10*3/uL (ref 0.7–4.0)
MCH: 27.9 pg (ref 26.0–34.0)
MCHC: 32 g/dL (ref 30.0–36.0)
MCV: 87.2 fL (ref 78.0–100.0)
Monocytes Absolute: 1.2 10*3/uL — ABNORMAL HIGH (ref 0.1–1.0)
Monocytes Relative: 9 %
NEUTROS PCT: 75 %
Neutro Abs: 9.3 10*3/uL — ABNORMAL HIGH (ref 1.7–7.7)
PLATELETS: 223 10*3/uL (ref 150–400)
RBC: 4.45 MIL/uL (ref 4.22–5.81)
RDW: 13.5 % (ref 11.5–15.5)
WBC: 12.4 10*3/uL — AB (ref 4.0–10.5)

## 2016-06-24 MED ORDER — SODIUM CHLORIDE 0.9 % IV SOLN
Freq: Once | INTRAVENOUS | Status: AC
  Administered 2016-06-24: 10:00:00 via INTRAVENOUS

## 2016-06-24 MED ORDER — PROCHLORPERAZINE MALEATE 10 MG PO TABS
10.0000 mg | ORAL_TABLET | Freq: Once | ORAL | Status: AC
  Administered 2016-06-24: 10 mg via ORAL
  Filled 2016-06-24: qty 1

## 2016-06-24 MED ORDER — PACLITAXEL PROTEIN-BOUND CHEMO INJECTION 100 MG
125.0000 mg/m2 | Freq: Once | INTRAVENOUS | Status: AC
  Administered 2016-06-24: 275 mg via INTRAVENOUS
  Filled 2016-06-24: qty 55

## 2016-06-24 MED ORDER — SODIUM CHLORIDE 0.9 % IV SOLN
1000.0000 mg/m2 | Freq: Once | INTRAVENOUS | Status: AC
  Administered 2016-06-24: 2090 mg via INTRAVENOUS
  Filled 2016-06-24: qty 54.97

## 2016-06-24 MED ORDER — INFLUENZA VAC SPLIT QUAD 0.5 ML IM SUSY
0.5000 mL | PREFILLED_SYRINGE | Freq: Once | INTRAMUSCULAR | Status: AC
  Administered 2016-06-24: 0.5 mL via INTRAMUSCULAR
  Filled 2016-06-24: qty 0.5

## 2016-06-24 NOTE — Progress Notes (Signed)
Jake Gonzalez tolerated chemo tx and Influenza vaccine well without complaints or incident.Reviewed side effects of these chemo medications with pt who verbalized understanding. VSS upon discharge. Pt discharged self ambulatory using cane in satisfactory condition with family

## 2016-06-24 NOTE — Patient Instructions (Signed)
[facility] Discharge Instructions for Patients Receiving Chemotherapy   Beginning January 23rd 2017 lab work for the Cancer Center will be done in the  Main lab at [facility] on 1st floor. If you have a lab appointment with the Cancer Center please come in thru the  Main Entrance and check in at the main information desk   Today you received the following chemotherapy agents Abraxane and Gemzar. Follow-up as scheduled. Call clinic for any questions or concerns  To help prevent nausea and vomiting after your treatment, we encourage you to take your nausea medication   If you develop nausea and vomiting, or diarrhea that is not controlled by your medication, call the clinic.  The clinic phone number is [phone]. Office hours are Monday-Friday 8:30am-5:00pm.  BELOW ARE SYMPTOMS THAT SHOULD BE REPORTED IMMEDIATELY:  *FEVER GREATER THAN 101.0 F  *CHILLS WITH OR WITHOUT FEVER  NAUSEA AND VOMITING THAT IS NOT CONTROLLED WITH YOUR NAUSEA MEDICATION  *UNUSUAL SHORTNESS OF BREATH  *UNUSUAL BRUISING OR BLEEDING  TENDERNESS IN MOUTH AND THROAT WITH OR WITHOUT PRESENCE OF ULCERS  *URINARY PROBLEMS  *BOWEL PROBLEMS  UNUSUAL RASH Items with * indicate a potential emergency and should be followed up as soon as possible. If you have an emergency after office hours please contact your primary care physician or go to the nearest emergency department.  Please call the clinic during office hours if you have any questions or concerns.   You may also contact the Patient Navigator at [phone] should you have any questions or need assistance in obtaining follow up care.      Resources For Cancer Patients and their Caregivers ? American Cancer Society: Can assist with transportation, wigs, general needs, runs Look Good Feel Better.        1-888-227-6333 ? Cancer Care: Provides financial assistance, online support groups, medication/co-pay assistance.   1-800-813-HOPE (4673) ? [facility] Assists [place] cancer patients and their families through emotional , educational and financial support.  336-427-4357 ? [place] DSS Where to apply for food stamps, Medicaid and utility assistance. 336-342-1394 ? [facility]: Transportation to medical appointments. 336-347-2287 ? Social Security Administration: May apply for disability if have a Stage IV cancer. 336-342-7796 1-800-772-1213 ? [place] Aging, Disability and Transit Services: Assists with nutrition, care and transit needs. 336-349-2343         

## 2016-06-25 LAB — CANCER ANTIGEN 19-9: CA 19-9: 70325 U/mL — ABNORMAL HIGH (ref 0–35)

## 2016-06-27 ENCOUNTER — Inpatient Hospital Stay (HOSPITAL_COMMUNITY)
Admission: EM | Admit: 2016-06-27 | Discharge: 2016-06-29 | DRG: 871 | Disposition: A | Discharge status: Home / Self Care | Payer: Medicare PPO | Attending: Internal Medicine | Admitting: Internal Medicine

## 2016-06-27 ENCOUNTER — Emergency Department (HOSPITAL_COMMUNITY): Payer: Medicare PPO

## 2016-06-27 ENCOUNTER — Encounter (HOSPITAL_COMMUNITY): Payer: Self-pay | Admitting: Emergency Medicine

## 2016-06-27 ENCOUNTER — Encounter (HOSPITAL_COMMUNITY): Payer: Self-pay

## 2016-06-27 ENCOUNTER — Other Ambulatory Visit: Payer: Self-pay

## 2016-06-27 ENCOUNTER — Other Ambulatory Visit (HOSPITAL_COMMUNITY): Payer: Self-pay | Admitting: Oncology

## 2016-06-27 DIAGNOSIS — Z86718 Personal history of other venous thrombosis and embolism: Secondary | ICD-10-CM

## 2016-06-27 DIAGNOSIS — J449 Chronic obstructive pulmonary disease, unspecified: Secondary | ICD-10-CM | POA: Diagnosis present

## 2016-06-27 DIAGNOSIS — K1230 Oral mucositis (ulcerative), unspecified: Secondary | ICD-10-CM | POA: Diagnosis present

## 2016-06-27 DIAGNOSIS — I25810 Atherosclerosis of coronary artery bypass graft(s) without angina pectoris: Secondary | ICD-10-CM | POA: Diagnosis not present

## 2016-06-27 DIAGNOSIS — Z981 Arthrodesis status: Secondary | ICD-10-CM | POA: Diagnosis not present

## 2016-06-27 DIAGNOSIS — Z809 Family history of malignant neoplasm, unspecified: Secondary | ICD-10-CM | POA: Diagnosis not present

## 2016-06-27 DIAGNOSIS — E860 Dehydration: Secondary | ICD-10-CM | POA: Diagnosis present

## 2016-06-27 DIAGNOSIS — Z888 Allergy status to other drugs, medicaments and biological substances status: Secondary | ICD-10-CM

## 2016-06-27 DIAGNOSIS — C259 Malignant neoplasm of pancreas, unspecified: Secondary | ICD-10-CM | POA: Diagnosis present

## 2016-06-27 DIAGNOSIS — R112 Nausea with vomiting, unspecified: Secondary | ICD-10-CM | POA: Diagnosis not present

## 2016-06-27 DIAGNOSIS — G8929 Other chronic pain: Secondary | ICD-10-CM | POA: Diagnosis present

## 2016-06-27 DIAGNOSIS — Z955 Presence of coronary angioplasty implant and graft: Secondary | ICD-10-CM | POA: Diagnosis not present

## 2016-06-27 DIAGNOSIS — R339 Retention of urine, unspecified: Secondary | ICD-10-CM | POA: Diagnosis present

## 2016-06-27 DIAGNOSIS — E872 Acidosis: Secondary | ICD-10-CM | POA: Diagnosis present

## 2016-06-27 DIAGNOSIS — Z823 Family history of stroke: Secondary | ICD-10-CM

## 2016-06-27 DIAGNOSIS — R6521 Severe sepsis with septic shock: Secondary | ICD-10-CM | POA: Diagnosis present

## 2016-06-27 DIAGNOSIS — I10 Essential (primary) hypertension: Secondary | ICD-10-CM | POA: Diagnosis not present

## 2016-06-27 DIAGNOSIS — M549 Dorsalgia, unspecified: Secondary | ICD-10-CM | POA: Diagnosis present

## 2016-06-27 DIAGNOSIS — R109 Unspecified abdominal pain: Secondary | ICD-10-CM

## 2016-06-27 DIAGNOSIS — C787 Secondary malignant neoplasm of liver and intrahepatic bile duct: Secondary | ICD-10-CM | POA: Diagnosis present

## 2016-06-27 DIAGNOSIS — E785 Hyperlipidemia, unspecified: Secondary | ICD-10-CM | POA: Diagnosis present

## 2016-06-27 DIAGNOSIS — Z8249 Family history of ischemic heart disease and other diseases of the circulatory system: Secondary | ICD-10-CM

## 2016-06-27 DIAGNOSIS — E7800 Pure hypercholesterolemia, unspecified: Secondary | ICD-10-CM | POA: Diagnosis present

## 2016-06-27 DIAGNOSIS — I252 Old myocardial infarction: Secondary | ICD-10-CM | POA: Diagnosis not present

## 2016-06-27 DIAGNOSIS — E119 Type 2 diabetes mellitus without complications: Secondary | ICD-10-CM | POA: Diagnosis present

## 2016-06-27 DIAGNOSIS — I2510 Atherosclerotic heart disease of native coronary artery without angina pectoris: Secondary | ICD-10-CM | POA: Diagnosis present

## 2016-06-27 DIAGNOSIS — N179 Acute kidney failure, unspecified: Secondary | ICD-10-CM | POA: Diagnosis present

## 2016-06-27 DIAGNOSIS — T451X5A Adverse effect of antineoplastic and immunosuppressive drugs, initial encounter: Secondary | ICD-10-CM | POA: Diagnosis present

## 2016-06-27 DIAGNOSIS — F064 Anxiety disorder due to known physiological condition: Secondary | ICD-10-CM | POA: Diagnosis present

## 2016-06-27 DIAGNOSIS — I9589 Other hypotension: Secondary | ICD-10-CM

## 2016-06-27 DIAGNOSIS — A419 Sepsis, unspecified organism: Secondary | ICD-10-CM | POA: Diagnosis not present

## 2016-06-27 DIAGNOSIS — Z79899 Other long term (current) drug therapy: Secondary | ICD-10-CM

## 2016-06-27 DIAGNOSIS — Z7984 Long term (current) use of oral hypoglycemic drugs: Secondary | ICD-10-CM

## 2016-06-27 DIAGNOSIS — F17210 Nicotine dependence, cigarettes, uncomplicated: Secondary | ICD-10-CM | POA: Diagnosis present

## 2016-06-27 DIAGNOSIS — Z79891 Long term (current) use of opiate analgesic: Secondary | ICD-10-CM

## 2016-06-27 DIAGNOSIS — F319 Bipolar disorder, unspecified: Secondary | ICD-10-CM | POA: Diagnosis present

## 2016-06-27 DIAGNOSIS — I959 Hypotension, unspecified: Secondary | ICD-10-CM

## 2016-06-27 LAB — URINALYSIS, ROUTINE W REFLEX MICROSCOPIC
Bilirubin Urine: NEGATIVE
Glucose, UA: 250 mg/dL — AB
HGB URINE DIPSTICK: NEGATIVE
Ketones, ur: NEGATIVE mg/dL
LEUKOCYTES UA: NEGATIVE
NITRITE: NEGATIVE
PROTEIN: NEGATIVE mg/dL
Specific Gravity, Urine: 1.015 (ref 1.005–1.030)
pH: 5 (ref 5.0–8.0)

## 2016-06-27 LAB — CBC WITH DIFFERENTIAL/PLATELET
BASOS ABS: 0 10*3/uL (ref 0.0–0.1)
BASOS PCT: 0 %
EOS PCT: 0 %
Eosinophils Absolute: 0 10*3/uL (ref 0.0–0.7)
HCT: 35.8 % — ABNORMAL LOW (ref 39.0–52.0)
Hemoglobin: 11.5 g/dL — ABNORMAL LOW (ref 13.0–17.0)
Lymphocytes Relative: 7 %
Lymphs Abs: 1.3 10*3/uL (ref 0.7–4.0)
MCH: 27.6 pg (ref 26.0–34.0)
MCHC: 32.1 g/dL (ref 30.0–36.0)
MCV: 85.9 fL (ref 78.0–100.0)
MONO ABS: 0.3 10*3/uL (ref 0.1–1.0)
Monocytes Relative: 1 %
NEUTROS ABS: 17.9 10*3/uL — AB (ref 1.7–7.7)
Neutrophils Relative %: 92 %
PLATELETS: 133 10*3/uL — AB (ref 150–400)
RBC: 4.17 MIL/uL — ABNORMAL LOW (ref 4.22–5.81)
RDW: 13.8 % (ref 11.5–15.5)
WBC: 19.6 10*3/uL — ABNORMAL HIGH (ref 4.0–10.5)

## 2016-06-27 LAB — COMPREHENSIVE METABOLIC PANEL
ALT: 29 U/L (ref 17–63)
ANION GAP: 13 (ref 5–15)
AST: 44 U/L — ABNORMAL HIGH (ref 15–41)
Albumin: 3.2 g/dL — ABNORMAL LOW (ref 3.5–5.0)
Alkaline Phosphatase: 132 U/L — ABNORMAL HIGH (ref 38–126)
BILIRUBIN TOTAL: 1.1 mg/dL (ref 0.3–1.2)
BUN: 34 mg/dL — AB (ref 6–20)
CO2: 22 mmol/L (ref 22–32)
Calcium: 8.1 mg/dL — ABNORMAL LOW (ref 8.9–10.3)
Chloride: 97 mmol/L — ABNORMAL LOW (ref 101–111)
Creatinine, Ser: 2.11 mg/dL — ABNORMAL HIGH (ref 0.61–1.24)
GFR, EST AFRICAN AMERICAN: 37 mL/min — AB (ref >60)
GFR, EST NON AFRICAN AMERICAN: 32 mL/min — AB (ref >60)
Glucose, Bld: 186 mg/dL — ABNORMAL HIGH (ref 65–99)
POTASSIUM: 4.7 mmol/L (ref 3.5–5.1)
Sodium: 132 mmol/L — ABNORMAL LOW (ref 135–145)
TOTAL PROTEIN: 6.9 g/dL (ref 6.5–8.1)

## 2016-06-27 LAB — I-STAT CG4 LACTIC ACID, ED
LACTIC ACID, VENOUS: 1.68 mmol/L (ref 0.5–1.9)
Lactic Acid, Venous: 1.46 mmol/L (ref 0.5–1.9)

## 2016-06-27 LAB — LACTIC ACID, PLASMA
Lactic Acid, Venous: 1.4 mmol/L (ref 0.5–1.9)
Lactic Acid, Venous: 0.9 mmol/L (ref 0.5–1.9)

## 2016-06-27 LAB — I-STAT CHEM 8, ED
BUN: 37 mg/dL — ABNORMAL HIGH (ref 6–20)
CREATININE: 1.9 mg/dL — AB (ref 0.61–1.24)
Calcium, Ion: 0.99 mmol/L — ABNORMAL LOW (ref 1.15–1.40)
Chloride: 99 mmol/L — ABNORMAL LOW (ref 101–111)
Glucose, Bld: 163 mg/dL — ABNORMAL HIGH (ref 65–99)
HEMATOCRIT: 35 % — AB (ref 39.0–52.0)
HEMOGLOBIN: 11.9 g/dL — AB (ref 13.0–17.0)
Potassium: 4.3 mmol/L (ref 3.5–5.1)
Sodium: 134 mmol/L — ABNORMAL LOW (ref 135–145)
TCO2: 23 mmol/L (ref 0–100)

## 2016-06-27 LAB — TYPE AND SCREEN
ABO/RH(D): A POS
ANTIBODY SCREEN: NEGATIVE

## 2016-06-27 LAB — GLUCOSE, CAPILLARY: Glucose-Capillary: 145 mg/dL — ABNORMAL HIGH (ref 65–99)

## 2016-06-27 LAB — MRSA PCR SCREENING: MRSA BY PCR: NEGATIVE

## 2016-06-27 MED ORDER — VANCOMYCIN HCL 10 G IV SOLR
1500.0000 mg | Freq: Once | INTRAVENOUS | Status: AC
  Administered 2016-06-27: 1500 mg via INTRAVENOUS
  Filled 2016-06-27: qty 1500

## 2016-06-27 MED ORDER — PIPERACILLIN-TAZOBACTAM 3.375 G IVPB 30 MIN
3.3750 g | Freq: Once | INTRAVENOUS | Status: AC
  Administered 2016-06-27: 3.375 g via INTRAVENOUS
  Filled 2016-06-27: qty 50

## 2016-06-27 MED ORDER — SODIUM CHLORIDE 0.9 % IV BOLUS (SEPSIS)
1000.0000 mL | Freq: Once | INTRAVENOUS | Status: AC
  Administered 2016-06-27: 1000 mL via INTRAVENOUS

## 2016-06-27 MED ORDER — APIXABAN 5 MG PO TABS
5.0000 mg | ORAL_TABLET | Freq: Every day | ORAL | Status: DC
  Administered 2016-06-27: 5 mg via ORAL
  Filled 2016-06-27: qty 1

## 2016-06-27 MED ORDER — MORPHINE SULFATE (PF) 2 MG/ML IV SOLN
1.0000 mg | INTRAVENOUS | Status: DC | PRN
  Administered 2016-06-27 – 2016-06-28 (×2): 1 mg via INTRAVENOUS
  Administered 2016-06-29: 2 mg via INTRAVENOUS
  Filled 2016-06-27 (×3): qty 1

## 2016-06-27 MED ORDER — VANCOMYCIN HCL IN DEXTROSE 1-5 GM/200ML-% IV SOLN: 1000.0000 mg | Freq: Once | INTRAVENOUS | Status: DC

## 2016-06-27 MED ORDER — GLUCERNA SHAKE PO LIQD
237.0000 mL | Freq: Two times a day (BID) | ORAL | Status: DC
  Administered 2016-06-28: 237 mL via ORAL

## 2016-06-27 MED ORDER — ENSURE ENLIVE PO LIQD
237.0000 mL | Freq: Two times a day (BID) | ORAL | Status: DC
  Administered 2016-06-28: 237 mL via ORAL

## 2016-06-27 MED ORDER — ONDANSETRON HCL 4 MG PO TABS: 8.0000 mg | ORAL_TABLET | Freq: Two times a day (BID) | ORAL | Status: DC | PRN

## 2016-06-27 MED ORDER — LACTATED RINGERS IV SOLN
INTRAVENOUS | Status: DC
  Administered 2016-06-28: 09:00:00 via INTRAVENOUS

## 2016-06-27 MED ORDER — ONDANSETRON HCL 4 MG/2ML IJ SOLN
4.0000 mg | Freq: Once | INTRAMUSCULAR | Status: AC
  Administered 2016-06-27: 4 mg via INTRAVENOUS
  Filled 2016-06-27: qty 2

## 2016-06-27 MED ORDER — VANCOMYCIN HCL IN DEXTROSE 750-5 MG/150ML-% IV SOLN
750.0000 mg | Freq: Two times a day (BID) | INTRAVENOUS | Status: DC
  Administered 2016-06-28: 750 mg via INTRAVENOUS
  Filled 2016-06-27 (×4): qty 150

## 2016-06-27 MED ORDER — ALBUTEROL SULFATE HFA 108 (90 BASE) MCG/ACT IN AERS: 2.0000 | INHALATION_SPRAY | Freq: Four times a day (QID) | RESPIRATORY_TRACT | Status: DC | PRN

## 2016-06-27 MED ORDER — LORAZEPAM 0.5 MG PO TABS: 0.5000 mg | ORAL_TABLET | ORAL | Status: DC | PRN

## 2016-06-27 MED ORDER — ALBUTEROL SULFATE (2.5 MG/3ML) 0.083% IN NEBU: 2.5000 mg | INHALATION_SOLUTION | Freq: Four times a day (QID) | RESPIRATORY_TRACT | Status: DC | PRN

## 2016-06-27 MED ORDER — SODIUM CHLORIDE 0.9 % IV SOLN
INTRAVENOUS | Status: DC
  Administered 2016-06-27 – 2016-06-28 (×2): via INTRAVENOUS

## 2016-06-27 MED ORDER — PROMETHAZINE HCL 25 MG/ML IJ SOLN
12.5000 mg | Freq: Four times a day (QID) | INTRAMUSCULAR | Status: DC | PRN
  Administered 2016-06-28: 12.5 mg via INTRAVENOUS
  Filled 2016-06-27: qty 1

## 2016-06-27 MED ORDER — FENTANYL CITRATE (PF) 100 MCG/2ML IJ SOLN
50.0000 ug | INTRAMUSCULAR | Status: DC | PRN
  Administered 2016-06-27 (×2): 50 ug via INTRAVENOUS
  Filled 2016-06-27 (×2): qty 2

## 2016-06-27 MED ORDER — PIPERACILLIN-TAZOBACTAM 3.375 G IVPB
3.3750 g | Freq: Three times a day (TID) | INTRAVENOUS | Status: DC
  Administered 2016-06-27 – 2016-06-29 (×5): 3.375 g via INTRAVENOUS
  Filled 2016-06-27 (×4): qty 50

## 2016-06-27 MED ORDER — PIPERACILLIN-TAZOBACTAM 3.375 G IVPB 30 MIN: 3.3750 g | Freq: Once | INTRAVENOUS | Status: DC

## 2016-06-27 MED ORDER — MORPHINE SULFATE ER 30 MG PO TBCR
45.0000 mg | EXTENDED_RELEASE_TABLET | Freq: Two times a day (BID) | ORAL | Status: DC
  Administered 2016-06-27 – 2016-06-29 (×4): 45 mg via ORAL
  Filled 2016-06-27: qty 3
  Filled 2016-06-27 (×2): qty 1
  Filled 2016-06-27: qty 3

## 2016-06-27 NOTE — Progress Notes (Signed)
24 hours follow up- pt arrived at the clinic.  Pt had been having fevers this weekend.  105 the highest.  Pt went to ER at [facility].  Was treated with 2 antibiotics, CT scan and blood work completed family states were all good. Pt is still having fevers even after taking ibuprofen through the weekend.  Pt has been vomiting all weekend. Spoke with Kirby Crigler PA and notified the pt to go to the ER to be worked up.  I took the pt down to the ER registration desk.

## 2016-06-27 NOTE — ED Provider Notes (Signed)
[facility] DEPT Provider Note   CSN: [id number] Arrival date & time: 06/27/16  [id number]  By signing my name below, I, Rayna Sexton, attest that this documentation has been prepared under the direction and in the presence of Elnora Morrison, MD. Electronically Signed: Rayna Sexton, ED Scribe. 06/27/16. 10:24 AM.   History   Chief Complaint Chief Complaint  Patient presents with  . Fever    HPI HPI Comments: Jake Gonzalez is a 60 y.o. male with a h/o pancreatic cancer, DM, COPD and CAD who presents to the Emergency Department complaining of constant, moderate, left sided abd pain x 3 days. Pt states he had his first chemotherapy treatment three days ago and was also given a flu shot. Afterwards he began experiencing his left sided abd pain, fever (TMAX 105 F three days ago and 97.4 F in triage) and n/v beginning two days ago. He was initially seen at [facility] and given IV abx further noting blood was found in his urine during this visit. Pt is on Eliquis but his relative denies he took his medication last night. He denies a h/o bowel obstruction or issues with his gallbladder. Pt has an allergy to steroid medications. He denies diarrhea, rash, leg swelling, HA and cough.   Oncologist: Dr. Whitney Muse  PCP: Dr. Woody Seller   The history is provided by the patient and a relative. No language interpreter was used.    Past Medical History:  Diagnosis Date  . CAD (coronary artery disease)   . Clotting disorder ([place])   . COPD (chronic obstructive pulmonary disease) ([place])   . Diabetes mellitus without complication ([place])   . High cholesterol   . Hypertension   . Myocardial infarction   . Pancreatic cancer [facility])     Patient Active Problem List   Diagnosis Date Noted  . Chemotherapy induced nausea and vomiting   . Septic shock ([place]) 06/27/2016  . Sepsis ([place]) 06/27/2016  . CAD (coronary artery disease) 06/27/2016  . Hypertension 06/27/2016  . High cholesterol 06/27/2016  . Pancreatic  cancer metastasized to liver ([place]) 06/21/2016  . Pancreatic mass 06/12/2016    Past Surgical History:  Procedure Laterality Date  . BACK SURGERY    . CARDIAC SURGERY     cardiac stents  . hemilaminectomy  2003  . HERNIA REPAIR    . LUMBAR FUSION  1997   L4-5 X2  . TONSILLECTOMY         Home Medications    Prior to Admission medications   Medication Sig Start Date End Date Taking? Authorizing Provider  albuterol (PROVENTIL HFA;VENTOLIN HFA) 108 (90 Base) MCG/ACT inhaler Inhale 2 puffs into the lungs every 6 (six) hours as needed for wheezing or shortness of breath.   Yes Historical Provider, MD  apixaban (ELIQUIS) 5 MG TABS tablet Take 5 mg by mouth at bedtime.   Yes Historical Provider, MD  atenolol (TENORMIN) 100 MG tablet Take 1 tablet by mouth daily. 12/25/15  Yes Historical Provider, MD  atorvastatin (LIPITOR) 10 MG tablet 10 mg daily.  11/18/15  Yes Historical Provider, MD  gemfibrozil (LOPID) 600 MG tablet Take 1 tablet by mouth daily. 11/05/15  Yes Historical Provider, MD  glimepiride (AMARYL) 4 MG tablet Take 1 tablet by mouth 2 (two) times daily. 11/16/15  Yes Historical Provider, MD  lisinopril (PRINIVIL,ZESTRIL) 20 MG tablet Take 1 tablet by mouth daily. 10/29/15  Yes Historical Provider, MD  LORazepam (ATIVAN) 0.5 MG tablet Take 1 tablet (0.5 mg total) by mouth every 4 (  four) hours as needed for anxiety (and nausea). 06/07/16  Yes Patrici Ranks, MD  metFORMIN (GLUCOPHAGE) 500 MG tablet Take 1 tablet by mouth. 2 tablets every morning and 1 tablet every evening 12/07/15  Yes Historical Provider, MD  morphine (MS CONTIN) 15 MG 12 hr tablet Take one 15 mg tab and one 30 mg tab to equal 45 mg every 12 hours. 06/07/16  Yes Patrici Ranks, MD  morphine (MS CONTIN) 30 MG 12 hr tablet Take one 15 mg tab and one 30 mg tab to equal 45 mg every 12 hours 06/07/16  Yes Patrici Ranks, MD  Red Yeast Rice 600 MG CAPS Take 1 capsule by mouth daily.   Yes Historical Provider, MD    levofloxacin (LEVAQUIN) 750 MG tablet Take 1 tablet (750 mg total) by mouth daily. 06/30/16   Thurnell Lose, MD  nystatin (MYCOSTATIN/NYSTOP) powder 1 application daily. 06/15/16   Historical Provider, MD  ondansetron (ZOFRAN) 8 MG tablet Take 1 tablet (8 mg total) by mouth 2 (two) times daily as needed (Nausea or vomiting). 06/22/16   Patrici Ranks, MD  pioglitazone (ACTOS) 15 MG tablet Take 1 tablet by mouth daily. 12/02/15   Historical Provider, MD  prochlorperazine (COMPAZINE) 10 MG tablet TAKE (1) TABLET EVERY SIX HOURS AS NEEDED FOR NAUSEA AND VOMITING. 06/24/16   Patrici Ranks, MD    Family History Family History  Problem Relation Age of Onset  . Heart disease Brother     prior to age 13  . Stroke Brother   . Hypertension Brother   . Cancer Mother     ovarian  . Alcoholism Father   . Cancer Maternal Uncle     stomach  . Other Brother     MVA    Social History Social History  Substance Use Topics  . Smoking status: Former Smoker    Packs/day: 2.00    Years: 35.00    Quit date: 11/21/2004  . Smokeless tobacco: Never Used  . Alcohol use No     Allergies   Other   Review of Systems Review of Systems  Constitutional: Positive for fever.  Respiratory: Negative for cough.   Cardiovascular: Negative for leg swelling.  Gastrointestinal: Positive for abdominal pain, nausea and vomiting. Negative for diarrhea.  Genitourinary: Positive for hematuria.  Skin: Negative for rash.  Neurological: Negative for headaches.  All other systems reviewed and are negative.  Physical Exam Updated Vital Signs BP 123/78 (BP Location: Right Arm)   Pulse 98   Temp 98.1 F (36.7 C) (Oral)   Resp 18   Ht 5\' 8"  (1.727 m)   Wt 204 lb 2.3 oz (92.6 kg)   SpO2 93%   BMI 31.04 kg/m   Physical Exam  Constitutional: He is oriented to person, place, and time.  Generally weak throughout exam  HENT:  Head: Normocephalic and atraumatic.  Mouth/Throat: Mucous membranes are dry.   Eyes: EOM are normal.  Neck: Normal range of motion. Neck supple.  Cardiovascular: Normal rate and regular rhythm.   Pulmonary/Chest: Effort normal. No respiratory distress.  Sparse crackles noted but otherwise all fields are clear  Abdominal: Soft. There is tenderness. There is no guarding.  Mild tenderness to the left mid abd. No guarding.   Musculoskeletal: Normal range of motion. He exhibits no edema.  No significant swelling noted to the legs.   Neurological: He is alert and oriented to person, place, and time.  Skin: Skin is warm and dry.  Multiple areas of superficial ecchymosis on the bilaterals arms.   Psychiatric: He has a normal mood and affect.  Nursing note and vitals reviewed.  ED Treatments / Results  Labs (all labs ordered are listed, but only abnormal results are displayed) Labs Reviewed  COMPREHENSIVE METABOLIC PANEL - Abnormal; Notable for the following:       Result Value   Sodium 132 (*)    Chloride 97 (*)    Glucose, Bld 186 (*)    BUN 34 (*)    Creatinine, Ser 2.11 (*)    Calcium 8.1 (*)    Albumin 3.2 (*)    AST 44 (*)    Alkaline Phosphatase 132 (*)    GFR calc non Af Amer 32 (*)    GFR calc Af Amer 37 (*)    All other components within normal limits  URINALYSIS, ROUTINE W REFLEX MICROSCOPIC (NOT AT [facility]) - Abnormal; Notable for the following:    Glucose, UA 250 (*)    All other components within normal limits  CBC WITH DIFFERENTIAL/PLATELET - Abnormal; Notable for the following:    WBC 19.6 (*)    RBC 4.17 (*)    Hemoglobin 11.5 (*)    HCT 35.8 (*)    Platelets 133 (*)    Neutro Abs 17.9 (*)    All other components within normal limits  COMPREHENSIVE METABOLIC PANEL - Abnormal; Notable for the following:    Sodium 132 (*)    Glucose, Bld 134 (*)    Calcium 7.2 (*)    Total Protein 5.9 (*)    Albumin 2.5 (*)    AST 47 (*)    All other components within normal limits  CBC WITH DIFFERENTIAL/PLATELET - Abnormal; Notable for the following:     WBC 11.4 (*)    RBC 3.51 (*)    Hemoglobin 9.6 (*)    HCT 30.2 (*)    Platelets 118 (*)    Neutro Abs 10.3 (*)    All other components within normal limits  GLUCOSE, CAPILLARY - Abnormal; Notable for the following:    Glucose-Capillary 145 (*)    All other components within normal limits  GLUCOSE, CAPILLARY - Abnormal; Notable for the following:    Glucose-Capillary 117 (*)    All other components within normal limits  BASIC METABOLIC PANEL - Abnormal; Notable for the following:    Sodium 134 (*)    Chloride 100 (*)    Glucose, Bld 136 (*)    Creatinine, Ser 0.57 (*)    Calcium 7.6 (*)    All other components within normal limits  CBC WITH DIFFERENTIAL/PLATELET - Abnormal; Notable for the following:    RBC 3.53 (*)    Hemoglobin 9.7 (*)    HCT 30.3 (*)    Platelets 88 (*)    All other components within normal limits  I-STAT CHEM 8, ED - Abnormal; Notable for the following:    Sodium 134 (*)    Chloride 99 (*)    BUN 37 (*)    Creatinine, Ser 1.90 (*)    Glucose, Bld 163 (*)    Calcium, Ion 0.99 (*)    Hemoglobin 11.9 (*)    HCT 35.0 (*)    All other components within normal limits  CULTURE, BLOOD (ROUTINE X 2)  CULTURE, BLOOD (ROUTINE X 2)  MRSA PCR SCREENING  URINE CULTURE  LACTIC ACID, PLASMA  LACTIC ACID, PLASMA  I-STAT CG4 LACTIC ACID, ED  I-STAT CG4 LACTIC ACID, ED  TYPE AND SCREEN    EKG  EKG Interpretation  Date/Time:  Monday June 27 2016 14:24:37 EDT Ventricular Rate:  94 PR Interval:    QRS Duration: 113 QT Interval:  360 QTC Calculation: 451 R Axis:   70 Text Interpretation:  Sinus rhythm Inferior infarct, old Probable anterolateral infarct, old No significant change since last tracing Confirmed by [email or web address]  MD, [place] [phone]) on 06/28/2016 12:09:24 PM       Radiology Dg Chest 2 View  Result Date: 06/27/2016 CLINICAL DATA:  Shortness of breath and left-sided chest pain EXAM: CHEST  2 VIEW COMPARISON:  06/24/2016 FINDINGS: Cardiac shadow  is stable. The lungs are well aerated bilaterally. No focal infiltrate or sizable effusion is seen. No bony abnormality is noted. Mild interstitial changes are again seen. IMPRESSION: No acute abnormality noted. Electronically Signed   By: Inez Catalina M.D.   On: 06/27/2016 10:37   Ct Renal Stone Study  Result Date: 06/27/2016 CLINICAL DATA:  Left flank pain. Stage IV pancreatic adenocarcinoma with first chemotherapy treatment 3 days ago. EXAM: CT ABDOMEN AND PELVIS WITHOUT CONTRAST TECHNIQUE: Multidetector CT imaging of the abdomen and pelvis was performed following the standard protocol without IV contrast. COMPARISON:  05/31/2016 FINDINGS: Lower chest: Mild, partially nodular opacity is partially visualized in the right middle lobe, improved from the prior study which demonstrated more confluent consolidation in this location. 5 mm subpleural nodule in the left lower lobe (series 6, image 5) and 5 mm nodule along the left major fissure (series 6, image 4) are unchanged. 4 mm right lower lobe nodule is also unchanged (series 6, image 13). There also a few additional subpleural nodules in the right lower lobe which measure 2-3 mm in size and may be infectious/inflammatory. No pleural effusion. Similar appearance of fat-containing hernia at the diaphragmatic hiatus with small amount of associated fluid. Coronary artery atherosclerosis. Hepatobiliary: Multiple hypoattenuating liver lesions are again seen, better demonstrated on the prior contrast-enhanced study and not substantially increased in size in the interim, with the largest measuring 3.9 cm in the left hepatic lobe. The gallbladder is mildly hydropic without evidence of wall thickening, pericholecystic inflammation, or biliary dilatation. Pancreas: Enlargement of the distal pancreatic body/tail is stable to minimally more prominent than on the prior study, with discrete measurement of the known underlying mass limited by the lack of IV contrast. Adjacent  subcentimeter in the adjacent pancreatic body is also again noted. Spleen: Multiple hypoattenuating splenic masses may have increased in size. Adrenals/Urinary Tract: 1.4 cm right adrenal nodule and 1.0 cm left adrenal nodule, unchanged. No renal calculi, hydronephrosis, or ureteral calculi identified. Unremarkable bladder. Stomach/Bowel: No evidence of bowel obstruction or inflammation. Mild sigmoid colon diverticulosis without evidence of diverticulitis. Unremarkable appendix. Vascular/Lymphatic: Advanced atherosclerosis of the abdominal aorta and its major branch vessels. Enlarged periportal lymph nodes measure up to 1.5 cm in short axis, not significantly changed. 12 mm short axis aortocaval lymph node is unchanged. Reproductive: Unremarkable prostate. Unchanged penile calcifications. Other: Unchanged fat-containing left inguinal hernia. Musculoskeletal: No suspicious lytic or blastic osseous lesions identified. Prior L4-5 PLIF. Advanced L2-3 disc degeneration. IMPRESSION: 1. No evidence of urinary tract calculi or obstruction. 2. Findings of known metastatic pancreatic cancer with detailed assessment limited by the lack of IV contrast. Stable to minimally increased masslike enlargement of the pancreatic tail, suspected increased size of splenic metastases, similar size of liver metastases, and unchanged lymphadenopathy. 3. Unchanged small adrenal nodules. 4. Improved right middle lobe aeration with scattered small nodules in  both lung bases, indeterminate. Electronically Signed   By: Logan Bores M.D.   On: 06/27/2016 12:44    Procedures Procedures  CRITICAL CARE Performed by: Mariea Clonts   Total critical care time: 75 minutes  Critical care time was exclusive of separately billable procedures and treating other patients.  Critical care was necessary to treat or prevent imminent or life-threatening deterioration.  Critical care was time spent personally by me on the following activities:  development of treatment plan with patient and/or surrogate as well as nursing, discussions with consultants, evaluation of patient's response to treatment, examination of patient, obtaining history from patient or surrogate, ordering and performing treatments and interventions, ordering and review of laboratory studies, ordering and review of radiographic studies, pulse oximetry and re-evaluation of patient's condition.  COORDINATION OF CARE: 10:18 AM Discussed next steps with pt. Pt verbalized understanding and is agreeable with the plan.    Medications Ordered in ED Medications  ondansetron (ZOFRAN) tablet 8 mg (not administered)  LORazepam (ATIVAN) tablet 0.5 mg (not administered)  morphine (MS CONTIN) 12 hr tablet 45 mg (45 mg Oral Given 06/29/16 0934)  albuterol (PROVENTIL) (2.5 MG/3ML) 0.083% nebulizer solution 2.5 mg (not administered)  promethazine (PHENERGAN) injection 12.5 mg (12.5 mg Intravenous Given 06/28/16 1713)  morphine 2 MG/ML injection 1-2 mg (2 mg Intravenous Given 06/29/16 0830)  feeding supplement (BOOST / RESOURCE BREEZE) liquid 1 Container (1 Container Oral Not Given 06/28/16 1500)  feeding supplement (ENSURE ENLIVE) (ENSURE ENLIVE) liquid 237 mL (237 mLs Oral Given 06/28/16 2000)  feeding supplement (GLUCERNA SHAKE) (GLUCERNA SHAKE) liquid 237 mL (not administered)  apixaban (ELIQUIS) tablet 5 mg (5 mg Oral Given 06/29/16 0934)  diclofenac sodium (VOLTAREN) 1 % transdermal gel 2 g (2 g Topical Not Given 06/28/16 2239)  sodium chloride 0.9 % bolus 250 mL (not administered)  levofloxacin (LEVAQUIN) tablet 750 mg (750 mg Oral Given 06/29/16 0934)  sodium chloride 0.9 % bolus 1,000 mL (0 mLs Intravenous Stopped 06/27/16 1215)  ondansetron (ZOFRAN) injection 4 mg (4 mg Intravenous Given 06/27/16 1058)  sodium chloride 0.9 % bolus 1,000 mL (1,000 mLs Intravenous New Bag/Given 06/27/16 1422)  sodium chloride 0.9 % bolus 1,000 mL (0 mLs Intravenous Stopped 06/27/16 1422)    piperacillin-tazobactam (ZOSYN) IVPB 3.375 g (0 g Intravenous Stopped 06/27/16 1506)  vancomycin (VANCOCIN) 1,500 mg in sodium chloride 0.9 % 500 mL IVPB (1,500 mg Intravenous New Bag/Given 06/27/16 1410)     Initial Impression / Assessment and Plan / ED Course  I have reviewed the triage vital signs and the nursing notes.  Pertinent labs & imaging results that were available during my care of the patient were reviewed by me and considered in my medical decision making (see chart for details).  Clinical Course    Final Clinical Impressions(s) / ED Diagnoses   Final diagnoses:  Left sided abdominal pain  Chemotherapy induced nausea and vomiting  Acute renal failure, unspecified acute renal failure type (HCC)  Dehydration  Other specified hypotension  Sepsis associated hypotension (HCC)   Pt with recurrent vomiting and fatigue.  Clinically dehydrated.  BP decr in ED.  Code sepsis called.  Multiple fluid boluses given.  Pt bp improved, broad abx.  The patients results and plan were reviewed and discussed.   Any x-rays performed were independently reviewed by myself.  Sepsis - Repeat Assessment  Performed at:      n  Vitals     Blood pressure 123/78, pulse 98, temperature 98.1 F (36.7 C),  temperature source Oral, resp. rate 18, height 5\' 8"  (1.727 m), weight 204 lb 2.3 oz (92.6 kg), SpO2 93 %.  Heart:     Tachycardic  Lungs:    clear  Capillary Refill:   <2 sec  Peripheral Pulse:   Radial pulse palpable  Skin:     Normal Color    Differential diagnosis were considered with the presenting HPI.  Medications  ondansetron (ZOFRAN) tablet 8 mg (not administered)  LORazepam (ATIVAN) tablet 0.5 mg (not administered)  morphine (MS CONTIN) 12 hr tablet 45 mg (45 mg Oral Given 06/29/16 0934)  albuterol (PROVENTIL) (2.5 MG/3ML) 0.083% nebulizer solution 2.5 mg (not administered)  promethazine (PHENERGAN) injection 12.5 mg (12.5 mg Intravenous Given 06/28/16 1713)  morphine 2  MG/ML injection 1-2 mg (2 mg Intravenous Given 06/29/16 0830)  feeding supplement (BOOST / RESOURCE BREEZE) liquid 1 Container (1 Container Oral Not Given 06/28/16 1500)  feeding supplement (ENSURE ENLIVE) (ENSURE ENLIVE) liquid 237 mL (237 mLs Oral Given 06/28/16 2000)  feeding supplement (GLUCERNA SHAKE) (GLUCERNA SHAKE) liquid 237 mL (not administered)  apixaban (ELIQUIS) tablet 5 mg (5 mg Oral Given 06/29/16 0934)  diclofenac sodium (VOLTAREN) 1 % transdermal gel 2 g (2 g Topical Not Given 06/28/16 2239)  sodium chloride 0.9 % bolus 250 mL (not administered)  levofloxacin (LEVAQUIN) tablet 750 mg (750 mg Oral Given 06/29/16 0934)  sodium chloride 0.9 % bolus 1,000 mL (0 mLs Intravenous Stopped 06/27/16 1215)  ondansetron (ZOFRAN) injection 4 mg (4 mg Intravenous Given 06/27/16 1058)  sodium chloride 0.9 % bolus 1,000 mL (1,000 mLs Intravenous New Bag/Given 06/27/16 1422)  sodium chloride 0.9 % bolus 1,000 mL (0 mLs Intravenous Stopped 06/27/16 1422)  piperacillin-tazobactam (ZOSYN) IVPB 3.375 g (0 g Intravenous Stopped 06/27/16 1506)  vancomycin (VANCOCIN) 1,500 mg in sodium chloride 0.9 % 500 mL IVPB (1,500 mg Intravenous New Bag/Given 06/27/16 1410)    Vitals:   06/28/16 0849 06/28/16 1135 06/28/16 2100 06/29/16 0456  BP:  123/65 123/65 123/78  Pulse:  (!) 102 (!) 110 98  Resp:  18 18 18   Temp: 98 F (36.7 C)  99.8 F (37.7 C) 98.1 F (36.7 C)  TempSrc: Oral  Oral Oral  SpO2:  100% 92% 93%  Weight:      Height:        Final diagnoses:  Left sided abdominal pain  Chemotherapy induced nausea and vomiting  Acute renal failure, unspecified acute renal failure type (HCC)  Dehydration  Other specified hypotension  Sepsis associated hypotension (HCC)    Admission/ observation were discussed with the admitting physician, patient and/or family and they are comfortable with the plan.   New Prescriptions Current Discharge Medication List    START taking these medications   Details    levofloxacin (LEVAQUIN) 750 MG tablet Take 1 tablet (750 mg total) by mouth daily. Qty: 3 tablet, Refills: 0         Elnora Morrison, MD 06/29/16 1015

## 2016-06-27 NOTE — ED Notes (Signed)
Pt is asymptomatic with blood pressure.  Denies dizziness.

## 2016-06-27 NOTE — Progress Notes (Addendum)
Pharmacy Antibiotic Note  Jake Gonzalez is a 60 y.o. male admitted on 06/27/2016 with septic shock.  Pharmacy has been consulted for vancomycin and zosyn dosing. Vanc 1500 and zosyn 3.375 gm ordered in the ED  Plan: Cont vanc 750 mg IV q12 hours Cont zosyn 3.375 gm IV q8 hours F/u renal function, cultures and clinical course  Height: 5\' 8"  (172.7 cm) Weight: 204 lb (92.5 kg) IBW/kg (Calculated) : 68.4  Temp (24hrs), Avg:98 F (36.7 C), Min:97.4 F (36.3 C), Max:98.5 F (36.9 C)   Recent Labs Lab 06/24/16 0841 06/27/16 0944 06/27/16 0957 06/27/16 1331 06/27/16 1340  WBC 12.4* 19.6*  --   --   --   CREATININE 1.10 2.11*  --   --  1.90*  LATICACIDVEN  --   --  1.68 1.46  --     Estimated Creatinine Clearance: 45 mL/min (by C-G formula based on SCr of 1.9 mg/dL (H)).    Allergies  Allergen Reactions  . Other     Steroids cause him to be very emotional and hyper.    Antimicrobials this admission: vanc 10/2 >>  zosyn 10/2 >>    Thank you for allowing pharmacy to be a part of this patient's care.  Excell Seltzer [place] 06/27/2016 3:49 PM   Addendum: renal function improved, vancomycin adjusted to 1500mg  IV q12h

## 2016-06-27 NOTE — H&P (Signed)
[facility] History and Physical  Jake Gonzalez [id number] DOB: 10-12-1955 DOA: 06/27/2016  Referring physician: Re[facility] ED PCP: Glenda Chroman, MD  Specialists:  none yet  Chief Complaint: fever and malaise  HPI:  60 y/o ? 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  Started on abraxane, Gemzar with palliative intent vasc claudication, work up ABI R>L 0.7 range Smoker till 2006  Patient recent the head started on chemotherapy as above on 06/23/2016 and then started having low-grade fevers discomfort He also seemed to develop some retention of urine and went to [facility] emergency room where he was given a dose of IV antibiotics, and another catheterization was performed  I called over to [facility] to get results from 06/24/2016 of Urine cult, but it appears that none were performed/BC showed no Growth so far He proceeded to feel poorly and this culminated on 06/26/2016 with him having protracted nausea vomiting and abdominal discomfort with inability to keep down any by mouth He is still passing gas but he is not having any diarrhea He has no chest pain His wife was fever was as high as 100 incentive resolved after being given first dose and have except more but then recurred He denies any other weakness on any one side that he has a slight rash over his right ankle He has no overt difficulty swallowing or dysphagia and does not have any burning in his throat.   In the emergency room noted that noted that white count was 19 up from 12 on 9/29 hemoglobin 11 platelet 133 Sodium 134 BUN/creatinine 30/2.1 lactic acid 1.6--1.4 alkaline phosphatase 132 AST 44 glucose 186 CT scan abdomen showed no urinary tract calculi/obstruction right middle lobe [email or web address] Scattered nodules bilaterally Chest x-ray showed clear lung  Blood  pressures were initially in the 80s patient was given 2 L of IV saline bolus, started on vancomycin and Zosyn and code sepsis called   Review of Systems: As above Past Medical History:  Diagnosis Date  . CAD (coronary artery disease)   . Clotting disorder ([place])   . COPD (chronic obstructive pulmonary disease) ([place])   . Diabetes mellitus without complication ([place])   . High cholesterol   . Hypertension   . Myocardial infarction   . Pancreatic cancer [facility])    Past Surgical History:  Procedure Laterality Date  . BACK SURGERY    . CARDIAC SURGERY     cardiac stents  . hemilaminectomy  2003  . HERNIA REPAIR    . LUMBAR FUSION  1997   L4-5 X2  . TONSILLECTOMY     Social History:  Social History   Social History Narrative  . No narrative on file    Allergies  Allergen Reactions  . Other     Steroids cause him to be very emotional and hyper.    Family History  Problem Relation Age of Onset  . Heart disease Brother     prior to age 74  . Stroke Brother   . Hypertension Brother   . Cancer Mother     ovarian  . Alcoholism Father   . Cancer Maternal Uncle     stomach  . Other Brother     MVA     Prior to Admission medications   Medication Sig Start Date End Date Taking? Authorizing Provider  albuterol (PROVENTIL HFA;VENTOLIN HFA) 108 (90 Base)  MCG/ACT inhaler Inhale 2 puffs into the lungs every 6 (six) hours as needed for wheezing or shortness of breath.   Yes Historical Provider, MD  apixaban (ELIQUIS) 5 MG TABS tablet Take 5 mg by mouth at bedtime.   Yes Historical Provider, MD  atenolol (TENORMIN) 100 MG tablet Take 1 tablet by mouth daily. 12/25/15  Yes Historical Provider, MD  atorvastatin (LIPITOR) 10 MG tablet 10 mg daily.  11/18/15  Yes Historical Provider, MD  gemfibrozil (LOPID) 600 MG tablet Take 1 tablet by mouth daily. 11/05/15  Yes Historical Provider, MD  glimepiride (AMARYL) 4 MG tablet Take 1 tablet by mouth 2 (two) times daily. 11/16/15  Yes Historical  Provider, MD  lisinopril (PRINIVIL,ZESTRIL) 20 MG tablet Take 1 tablet by mouth daily. 10/29/15  Yes Historical Provider, MD  LORazepam (ATIVAN) 0.5 MG tablet Take 1 tablet (0.5 mg total) by mouth every 4 (four) hours as needed for anxiety (and nausea). 06/07/16  Yes Patrici Ranks, MD  metFORMIN (GLUCOPHAGE) 500 MG tablet Take 1 tablet by mouth. 2 tablets every morning and 1 tablet every evening 12/07/15  Yes Historical Provider, MD  morphine (MS CONTIN) 15 MG 12 hr tablet Take one 15 mg tab and one 30 mg tab to equal 45 mg every 12 hours. 06/07/16  Yes Patrici Ranks, MD  morphine (MS CONTIN) 30 MG 12 hr tablet Take one 15 mg tab and one 30 mg tab to equal 45 mg every 12 hours 06/07/16  Yes Patrici Ranks, MD  Red Yeast Rice 600 MG CAPS Take 1 capsule by mouth daily.   Yes Historical Provider, MD  nystatin (MYCOSTATIN/NYSTOP) powder 1 application daily. 06/15/16   Historical Provider, MD  ondansetron (ZOFRAN) 8 MG tablet Take 1 tablet (8 mg total) by mouth 2 (two) times daily as needed (Nausea or vomiting). 06/22/16   Patrici Ranks, MD  pioglitazone (ACTOS) 15 MG tablet Take 1 tablet by mouth daily. 12/02/15   Historical Provider, MD  prochlorperazine (COMPAZINE) 10 MG tablet TAKE (1) TABLET EVERY SIX HOURS AS NEEDED FOR NAUSEA AND VOMITING. 06/24/16   Patrici Ranks, MD   Physical Exam: Vitals:   06/27/16 1130 06/27/16 1230 06/27/16 1300 06/27/16 1330  BP: 97/58 (!) 82/49 (!) 88/58 (!) 82/58  Pulse: 84 87 87 89  Resp: 17 15 15 15   Temp:      TempSrc:      SpO2: 95% 96% 98% 96%  Weight:      Height:        Alert obese pleasant in no apparent distress Throat is clear no erythema to posterior throat Mallampati 3 Chest is clinically clear no added sound No CVA tenderness Abdomen soft with slight tenderness on the left middle quadrant No rebound no guarding and no organomegaly Obese Mild blanching rash over the anterior aspect of the lower shin at the ankle Range of motion  intact, euthymic, and Power 5/5 Smile symmetric External ocular movements intact  Labs on Admission:  Basic Metabolic Panel:  Recent Labs Lab 06/24/16 0841 06/27/16 0944 06/27/16 1340  NA 133* 132* 134*  K 5.1 4.7 4.3  CL 96* 97* 99*  CO2 27 22  --   GLUCOSE 201* 186* 163*  BUN 18 34* 37*  CREATININE 1.10 2.11* 1.90*  CALCIUM 9.1 8.1*  --    Liver Function Tests:  Recent Labs Lab 06/24/16 0841 06/27/16 0944  AST 34 44*  ALT 29 29  ALKPHOS 185* 132*  BILITOT 0.6 1.1  PROT 7.5 6.9  ALBUMIN 3.6 3.2*   No results for input(s): LIPASE, AMYLASE in the last 168 hours. No results for input(s): AMMONIA in the last 168 hours. CBC:  Recent Labs Lab 06/24/16 0841 06/27/16 0944 06/27/16 1340  WBC 12.4* 19.6*  --   NEUTROABS 9.3* 17.9*  --   HGB 12.4* 11.5* 11.9*  HCT 38.8* 35.8* 35.0*  MCV 87.2 85.9  --   PLT 223 133*  --    Cardiac Enzymes: No results for input(s): CKTOTAL, CKMB, CKMBINDEX, TROPONINI in the last 168 hours.  BNP (last 3 results) No results for input(s): BNP in the last 8760 hours.  ProBNP (last 3 results) No results for input(s): PROBNP in the last 8760 hours.  CBG: No results for input(s): GLUCAP in the last 168 hours.  Radiological Exams on Admission: Dg Chest 2 View  Result Date: 06/27/2016 CLINICAL DATA:  Shortness of breath and left-sided chest pain EXAM: CHEST  2 VIEW COMPARISON:  06/24/2016 FINDINGS: Cardiac shadow is stable. The lungs are well aerated bilaterally. No focal infiltrate or sizable effusion is seen. No bony abnormality is noted. Mild interstitial changes are again seen. IMPRESSION: No acute abnormality noted. Electronically Signed   By: Inez Catalina M.D.   On: 06/27/2016 10:37   Ct Renal Stone Study  Result Date: 06/27/2016 CLINICAL DATA:  Left flank pain. Stage IV pancreatic adenocarcinoma with first chemotherapy treatment 3 days ago. EXAM: CT ABDOMEN AND PELVIS WITHOUT CONTRAST TECHNIQUE: Multidetector CT imaging of the  abdomen and pelvis was performed following the standard protocol without IV contrast. COMPARISON:  05/31/2016 FINDINGS: Lower chest: Mild, partially nodular opacity is partially visualized in the right middle lobe, improved from the prior study which demonstrated more confluent consolidation in this location. 5 mm subpleural nodule in the left lower lobe (series 6, image 5) and 5 mm nodule along the left major fissure (series 6, image 4) are unchanged. 4 mm right lower lobe nodule is also unchanged (series 6, image 13). There also a few additional subpleural nodules in the right lower lobe which measure 2-3 mm in size and may be infectious/inflammatory. No pleural effusion. Similar appearance of fat-containing hernia at the diaphragmatic hiatus with small amount of associated fluid. Coronary artery atherosclerosis. Hepatobiliary: Multiple hypoattenuating liver lesions are again seen, better demonstrated on the prior contrast-enhanced study and not substantially increased in size in the interim, with the largest measuring 3.9 cm in the left hepatic lobe. The gallbladder is mildly hydropic without evidence of wall thickening, pericholecystic inflammation, or biliary dilatation. Pancreas: Enlargement of the distal pancreatic body/tail is stable to minimally more prominent than on the prior study, with discrete measurement of the known underlying mass limited by the lack of IV contrast. Adjacent subcentimeter in the adjacent pancreatic body is also again noted. Spleen: Multiple hypoattenuating splenic masses may have increased in size. Adrenals/Urinary Tract: 1.4 cm right adrenal nodule and 1.0 cm left adrenal nodule, unchanged. No renal calculi, hydronephrosis, or ureteral calculi identified. Unremarkable bladder. Stomach/Bowel: No evidence of bowel obstruction or inflammation. Mild sigmoid colon diverticulosis without evidence of diverticulitis. Unremarkable appendix. Vascular/Lymphatic: Advanced atherosclerosis of  the abdominal aorta and its major branch vessels. Enlarged periportal lymph nodes measure up to 1.5 cm in short axis, not significantly changed. 12 mm short axis aortocaval lymph node is unchanged. Reproductive: Unremarkable prostate. Unchanged penile calcifications. Other: Unchanged fat-containing left inguinal hernia. Musculoskeletal: No suspicious lytic or blastic osseous lesions identified. Prior L4-5 PLIF. Advanced L2-3 disc degeneration. IMPRESSION: 1. No  evidence of urinary tract calculi or obstruction. 2. Findings of known metastatic pancreatic cancer with detailed assessment limited by the lack of IV contrast. Stable to minimally increased masslike enlargement of the pancreatic tail, suspected increased size of splenic metastases, similar size of liver metastases, and unchanged lymphadenopathy. 3. Unchanged small adrenal nodules. 4. Improved right middle lobe aeration with scattered small nodules in both lung bases, indeterminate. Electronically Signed   By: Logan Bores M.D.   On: 06/27/2016 12:44    EKG: Independently reviewed. nad PR 0 point 12 QRS axis 45 no ST-T wave changes across precordium slightly tachycardic  Assessment/Plan    Sepsis ([place]) Etiology is unclear Could be subacute aspiration? Versus palliative nephritis secondary to urinary retention Patient also could be hypotensive secondary to taking his blood sugar medications while volume depleted His medications seem to cause more mucositis and esophageal issues in addition to leukopenia and not leukocytosis Would still cover broadly with vancomycin and Zosyn I have asked nursing to repeat urine culture from catheterized specimen, blood cultures are pending Results from [facility] I did obtain as above which were noncontributory  Metastatic pancreatic cancer Hold Abraxane and gemcitabine for now Would potentially need dose reduction for nausea vomiting and no other symptomatology Allow diet and give Zofran ODT high-dose hold  off on Compazine 10 mg every 6 when necessary If needed will place on IV Phenergan  Volume depletion the BUN/creatinine is bumped from 1-2 range which is probably secondary to acute GI losses in addition to taking that H while ill and unable to tolerate fluid and diet Resume and recheck labs in a.m.    CAD (coronary artery disease) with hypotension Monitor on telemetry Has had heart attacks and maybe 2003 at [facility] When the patient blood pressures improved can resume atenolol 100 daily, lisinopril 20 daily  Hyperlipidemia hold off of red yeast rice, atorvastatin 10, Lopid 600 daily for now  Chronic low back. Continue MS Contin 45 mg every 12 hourly  Bipolar/Situational anxiety secondary to cancer diagnosis Continue Ativan 0.5 every 4 when necessary  Recent diagnosis lower extremity DVT Continue Elliquis 5 mg daily at bedtime    Hypertension See above discussion  Diabetes mellitus Hold Amaryl 4 mg daily, metformin 500 2 tablets a.m., 1 tablet p.m., Actos 1 tablet daily Lactic acidosis could be secondary also to metformin     Full CODE STATUS at present time Will forward to his oncologist for her information Expect 2-3 days needed inpatient  Verlon Au [facility] Pager [phone]  If 7PM-7AM, please contact night-coverage [email or web address] Password [email or web address] 06/27/2016, 2:09 PM

## 2016-06-27 NOTE — ED Notes (Signed)
Pt attempting to obtain UA.

## 2016-06-27 NOTE — ED Triage Notes (Signed)
Pt reports he received his first chemo treatment Friday and started having a fever that night  Of 105.  Wife took pt to [facility] er and was given iv antibiotics.  Reports started vomiting Saturday evening.  Pt c/o pain in left side of abd.

## 2016-06-28 LAB — CBC WITH DIFFERENTIAL/PLATELET
BASOS ABS: 0 10*3/uL (ref 0.0–0.1)
Basophils Relative: 0 %
EOS ABS: 0 10*3/uL (ref 0.0–0.7)
EOS PCT: 0 %
HCT: 30.2 % — ABNORMAL LOW (ref 39.0–52.0)
Hemoglobin: 9.6 g/dL — ABNORMAL LOW (ref 13.0–17.0)
LYMPHS PCT: 8 %
Lymphs Abs: 0.9 10*3/uL (ref 0.7–4.0)
MCH: 27.4 pg (ref 26.0–34.0)
MCHC: 31.8 g/dL (ref 30.0–36.0)
MCV: 86 fL (ref 78.0–100.0)
Monocytes Absolute: 0.2 10*3/uL (ref 0.1–1.0)
Monocytes Relative: 1 %
NEUTROS PCT: 90 %
Neutro Abs: 10.3 10*3/uL — ABNORMAL HIGH (ref 1.7–7.7)
PLATELETS: 118 10*3/uL — AB (ref 150–400)
RBC: 3.51 MIL/uL — AB (ref 4.22–5.81)
RDW: 13.7 % (ref 11.5–15.5)
WBC: 11.4 10*3/uL — AB (ref 4.0–10.5)

## 2016-06-28 LAB — COMPREHENSIVE METABOLIC PANEL
ALBUMIN: 2.5 g/dL — AB (ref 3.5–5.0)
ALT: 31 U/L (ref 17–63)
AST: 47 U/L — AB (ref 15–41)
Alkaline Phosphatase: 101 U/L (ref 38–126)
Anion gap: 8 (ref 5–15)
BILIRUBIN TOTAL: 1.2 mg/dL (ref 0.3–1.2)
BUN: 17 mg/dL (ref 6–20)
CO2: 23 mmol/L (ref 22–32)
Calcium: 7.2 mg/dL — ABNORMAL LOW (ref 8.9–10.3)
Chloride: 101 mmol/L (ref 101–111)
Creatinine, Ser: 0.87 mg/dL (ref 0.61–1.24)
Glucose, Bld: 134 mg/dL — ABNORMAL HIGH (ref 65–99)
POTASSIUM: 4.2 mmol/L (ref 3.5–5.1)
SODIUM: 132 mmol/L — AB (ref 135–145)
TOTAL PROTEIN: 5.9 g/dL — AB (ref 6.5–8.1)

## 2016-06-28 LAB — GLUCOSE, CAPILLARY: GLUCOSE-CAPILLARY: 117 mg/dL — AB (ref 65–99)

## 2016-06-28 MED ORDER — GLUCERNA SHAKE PO LIQD: 237.0000 mL | Freq: Two times a day (BID) | ORAL | Status: DC

## 2016-06-28 MED ORDER — VANCOMYCIN HCL 10 G IV SOLR
1500.0000 mg | Freq: Two times a day (BID) | INTRAVENOUS | Status: DC
  Administered 2016-06-28 – 2016-06-29 (×2): 1500 mg via INTRAVENOUS
  Filled 2016-06-28 (×3): qty 1500

## 2016-06-28 MED ORDER — APIXABAN 5 MG PO TABS
5.0000 mg | ORAL_TABLET | Freq: Two times a day (BID) | ORAL | Status: DC
  Administered 2016-06-28 – 2016-06-29 (×3): 5 mg via ORAL
  Filled 2016-06-28 (×3): qty 1

## 2016-06-28 MED ORDER — BOOST / RESOURCE BREEZE PO LIQD: 1.0000 | Freq: Once | ORAL | Status: DC

## 2016-06-28 MED ORDER — DICLOFENAC SODIUM 1 % TD GEL
2.0000 g | Freq: Four times a day (QID) | TRANSDERMAL | Status: DC
  Administered 2016-06-28 (×2): 2 g via TOPICAL
  Filled 2016-06-28: qty 100

## 2016-06-28 MED ORDER — ENSURE ENLIVE PO LIQD
237.0000 mL | ORAL | Status: DC
  Administered 2016-06-28: 237 mL via ORAL

## 2016-06-28 NOTE — Progress Notes (Signed)
Initial Nutrition Assessment  DOCUMENTATION CODES:  Obesity unspecified  INTERVENTION:  Left RDcontact info, coupons, and handouts titled "Nausea and Vomiting"   Ensure Enlive po q24, each supplement provides 350 kcal and 20 grams of protein  Boost Breeze po q24, each supplement provides 250 kcal and 9 grams of protein  NUTRITION DIAGNOSIS:  Increased nutrient needs related to cancer and cancer related treatments as evidenced by loss of >10% bw in <6 months.  GOAL:  Patient will meet greater than or equal to 90% of their needs  MONITOR:  PO intake, Supplement acceptance, Diet advancement, Labs, Weight trends  REASON FOR ASSESSMENT:  Malnutrition Screening Tool    ASSESSMENT:  60 y/o male PMHx CAD, MI, DM, COPD and recent diagnosis with metastatic pancreatic cancer. Presents with intractable nausea and vomiting, abdominal discomfort and inability to keep down any PO intake. He received chemotherapy 9/29. Worked up for sepsis and volume depletion.   Multiple family members were present in patients room. Patient reports that his appetite at this time is very good. He ate 100% of his breakfast. PTA he also reports a good appetite; "I stay hungry". He denies any instances of poor PO intake since his diagnosis. RD asked about any postprandial diarrhea that may be related to fat malabsorption. He denies diarrhea and is more on the constipated side. N/V was a new symptom and thought to be related to chemo.   Pt reports UBW as ~230 lbs. He has lost 25 lbs in about 5 months which is a significant change.   Given his weight loss despite a reported good PO intake, RD recommended supplements. Pt reports recently buying Butter Pecan Ensure. RD explained the ensure assistance program through [facility]. Will order a case.   RD went over recommendations for nausea and vomiting. During times of nausea, RD recommended avoiding fat and Ensure Clear/Boost Breeze would be more appropriate.  RD addressed importance of hydration. He says he drinks large quantities of water and is his beverage of choice. Explained a beverage such as milk would be better given it contains protein, calories, and is a better hydrator. Recommended ginger extract and sea-bands as some lesser known strategies to reduce nausea.  Medications: Ensure Enlive, Morphine, IV abx, IVF Labs:  Albumin: 2.5,  WBC: 11.4, Total pro: 5.9, Calcium: 7.7, Hyperglycemic  Recent Labs Lab 06/24/16 0841 06/27/16 0944 06/27/16 1340 06/28/16 0431  NA 133* 132* 134* 132*  K 5.1 4.7 4.3 4.2  CL 96* 97* 99* 101  CO2 27 22  --  23  BUN 18 34* 37* 17  CREATININE 1.10 2.11* 1.90* 0.87  CALCIUM 9.1 8.1*  --  7.2*  GLUCOSE 201* 186* 163* 134*    Diet Order:  Diet Heart Room service appropriate? Yes; Fluid consistency: Thin  Skin:Blister on Left toe, incision to abdomen  Last BM:  10/1  Height:  Ht Readings from Last 1 Encounters:  06/27/16 5\' 8"  (1.727 m)   Weight:  Wt Readings from Last 1 Encounters:  06/28/16 204 lb 2.3 oz (92.6 kg)   Wt Readings from Last 10 Encounters:  06/28/16 204 lb 2.3 oz (92.6 kg)  06/24/16 204 lb 12.8 oz (92.9 kg)  06/21/16 204 lb 12.8 oz (92.9 kg)  06/14/16 208 lb (94.3 kg)  06/07/16 210 lb 12.8 oz (95.6 kg)  01/18/16 229 lb (103.9 kg)  12/28/15 220 lb (99.8 kg)   Ideal Body Weight:  70 kg  BMI:  Body mass index is 31.04 kg/m.  Estimated Nutritional Needs:  Kcal:  1950-2150 (21-23 kcal/kg bw) Protein:  90-105 g (1.3-1.5 g/kg ibw) Fluid:  >2 liters  EDUCATION NEEDS:  Education needs addressed  Burtis Junes RD, LDN, CNSC Clinical Nutrition Pager: [phone] 06/28/2016 11:39 AM

## 2016-06-28 NOTE — Progress Notes (Signed)
Jake Gonzalez [id number] DOB: [birth year] DOA: 06/27/2016 PCP: Glenda Chroman, MD  Brief narrative: 60 y/o ? 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             Started on abraxane, Gemzar with palliative intent vasc claudication, work up ABI R>L 0.7 range Smoker till 2006  Patient recent the head started on chemotherapy as above on 06/23/2016 and then started having low-grade fevers discomfort He also seemed to develop some retention of urine and went to [facility] emergency room where he was given a dose of IV antibiotics, and another catheterization was performed  I called over to [facility] to get results from 06/24/2016 of Urine cult, but it appears that none were performed/BC showed no Growth so far He proceeded to feel poorly and this culminated on 06/26/2016 with him having protracted nausea vomiting and abdominal discomfort with inability to keep down any by mouth He is still passing gas but he is not having any diarrhea  Admitted 06/28/16 with fever, AKI hypotnsion and signs and symptoms of sepsis Some lower quadrant abd pain  Past medical history-As per Problem list Chart reviewed as below-   Consultants:    Procedures:    Antibiotics:  Vancomycin 10/2  Zosyn 10/21   Subjective   Alert pleasant oriented and in nad tol diet doing well looking much improved No retention and passing urine freely No cough  No cold No CP NO N/v  Rash about the same   Objective    Interim History:   Telemetry: Sinus tach ~ 110 occasionally   Objective: Vitals:   06/28/16 0400 06/28/16 0435 06/28/16 0500 06/28/16 0600  BP: (!) 95/53  103/84 105/74  Pulse: 95  95 98  Resp: 18  20 17   Temp:  99.2 F (37.3 C)    TempSrc:  Oral    SpO2: 99%  100% 91%  Weight:  92.6 kg (204 lb 2.3 oz)    Height:         Intake/Output Summary (Last 24 hours) at 06/28/16 0835 Last data filed at 06/28/16 0600  Gross per 24 hour  Intake          3741.67 ml  Output             2125 ml  Net          1616.67 ml    Exam:  General: eomi ncat Cardiovascular:  Sq s 2no m/r/g Respiratory: clear no added sound  Abdomen:  Soft nt nd no reboudn no guard, ? HSM-habitus challenging Skin rash about the same Neuro intact no focal deficit  Data Reviewed: Basic Metabolic Panel:  Recent Labs Lab 06/24/16 0841 06/27/16 0944 06/27/16 1340 06/28/16 0431  NA 133* 132* 134* 132*  K 5.1 4.7 4.3 4.2  CL 96* 97* 99* 101  CO2 27 22  --  23  GLUCOSE 201* 186* 163* 134*  BUN 18 34* 37* 17  CREATININE 1.10 2.11* 1.90* 0.87  CALCIUM 9.1 8.1*  --  7.2*   Liver Function Tests:  Recent Labs Lab 06/24/16 0841 06/27/16 0944 06/28/16 0431  AST 34 44* 47*  ALT 29 29 31   ALKPHOS 185* 132* 101  BILITOT 0.6 1.1 1.2  PROT 7.5 6.9 5.9*  ALBUMIN 3.6 3.2* 2.5*   No results for input(s): LIPASE, AMYLASE in the last 168 hours. No  results for input(s): AMMONIA in the last 168 hours. CBC:  Recent Labs Lab 06/24/16 0841 06/27/16 0944 06/27/16 1340 06/28/16 0431  WBC 12.4* 19.6*  --  11.4*  NEUTROABS 9.3* 17.9*  --  10.3*  HGB 12.4* 11.5* 11.9* 9.6*  HCT 38.8* 35.8* 35.0* 30.2*  MCV 87.2 85.9  --  86.0  PLT 223 133*  --  118*   Cardiac Enzymes: No results for input(s): CKTOTAL, CKMB, CKMBINDEX, TROPONINI in the last 168 hours. BNP: Invalid input(s): POCBNP CBG:  Recent Labs Lab 06/27/16 1958  GLUCAP 145*    Recent Results (from the past 240 hour(s))  MRSA PCR Screening     Status: None   Collection Time: 06/27/16  4:21 PM  Result Value Ref Range Status   MRSA by PCR NEGATIVE NEGATIVE Final    Comment:        The GeneXpert MRSA Assay (FDA approved for NASAL specimens only), is one component of a comprehensive MRSA colonization surveillance program. It is not intended to diagnose  MRSA infection nor to guide or monitor treatment for MRSA infections.      Studies:              All Imaging reviewed and is as per above notation   Scheduled Meds: . apixaban  5 mg Oral QHS  . feeding supplement (ENSURE ENLIVE)  237 mL Oral BID BM  . feeding supplement (GLUCERNA SHAKE)  237 mL Oral BID BM  . morphine  45 mg Oral Q12H  . piperacillin-tazobactam (ZOSYN)  IV  3.375 g Intravenous Q8H  . vancomycin  750 mg Intravenous Q12H   Continuous Infusions: . sodium chloride 100 mL/hr at 06/28/16 0600  . lactated ringers       Assessment/Plan  Sepsis ([place]) Etiology still unclear Would still cover broadly with vancomycin and Zosyn repeat urine culture from catheterized specimen, blood cultures are pending Results from [facility] I did obtain as above which were noncontributory  Metastatic pancreatic cancer Hold Abraxane and gemcitabine for now  dose reduction for nausea vomiting ?--suppsoed to have chemo this Friday will need to hold off for now Allow diet and give Zofran ODT high-dose hold off on Compazine 10 mg every 6 when necessary cotninue IV Phenergan  Volume depletion the BUN/creatinine is bumped from 1-2 range which is probably secondary to acute GI losses  37/1.9--->17/0.87 Cut back saline to 50 cc/h    CAD (coronary artery disease) with hypotension Monitor on telemetry Has had heart attacks and maybe 2003 at [facility] When the patient blood pressures improve can resume atenolol 100 daily, lisinopril 20 daily  Hyperlipidemia hold off of red yeast rice, atorvastatin 10, Lopid 600 daily for now  Chronic low back. Continue MS Contin 45 mg every 12 hourly  Bipolar/Situational anxiety secondary to cancer diagnosis Continue Ativan 0.5 every 4 when necessary  Recent diagnosis lower extremity DVT Continue Elliquis 5 mg daily at bedtime    Hypertension See above discussion  Diabetes mellitus Hold Amaryl 4 mg daily, metformin 500 2 tablets  a.m., 1 tablet p.m., Actos 1 tablet daily Lactic acidosis could be secondary also to metformin Sugar range 145     Verneita Griffes, MD  [facility] Pager [phone] 06/28/2016, 8:35 AM    LOS: 1 day

## 2016-06-29 ENCOUNTER — Other Ambulatory Visit (HOSPITAL_COMMUNITY): Payer: Self-pay | Admitting: Emergency Medicine

## 2016-06-29 DIAGNOSIS — R112 Nausea with vomiting, unspecified: Secondary | ICD-10-CM

## 2016-06-29 DIAGNOSIS — T451X5A Adverse effect of antineoplastic and immunosuppressive drugs, initial encounter: Secondary | ICD-10-CM

## 2016-06-29 LAB — BASIC METABOLIC PANEL
ANION GAP: 9 (ref 5–15)
BUN: 10 mg/dL (ref 6–20)
CALCIUM: 7.6 mg/dL — AB (ref 8.9–10.3)
CO2: 25 mmol/L (ref 22–32)
CREATININE: 0.57 mg/dL — AB (ref 0.61–1.24)
Chloride: 100 mmol/L — ABNORMAL LOW (ref 101–111)
GLUCOSE: 136 mg/dL — AB (ref 65–99)
Potassium: 3.6 mmol/L (ref 3.5–5.1)
Sodium: 134 mmol/L — ABNORMAL LOW (ref 135–145)

## 2016-06-29 LAB — CBC WITH DIFFERENTIAL/PLATELET
BASOS ABS: 0 10*3/uL (ref 0.0–0.1)
BASOS PCT: 0 %
EOS ABS: 0.1 10*3/uL (ref 0.0–0.7)
Eosinophils Relative: 1 %
HEMATOCRIT: 30.3 % — AB (ref 39.0–52.0)
Hemoglobin: 9.7 g/dL — ABNORMAL LOW (ref 13.0–17.0)
Lymphocytes Relative: 18 %
Lymphs Abs: 0.8 10*3/uL (ref 0.7–4.0)
MCH: 27.5 pg (ref 26.0–34.0)
MCHC: 32 g/dL (ref 30.0–36.0)
MCV: 85.8 fL (ref 78.0–100.0)
MONO ABS: 0.1 10*3/uL (ref 0.1–1.0)
Monocytes Relative: 2 %
NEUTROS ABS: 3.4 10*3/uL (ref 1.7–7.7)
NEUTROS PCT: 78 %
Platelets: 88 10*3/uL — ABNORMAL LOW (ref 150–400)
RBC: 3.53 MIL/uL — ABNORMAL LOW (ref 4.22–5.81)
RDW: 13.7 % (ref 11.5–15.5)
WBC: 4.4 10*3/uL (ref 4.0–10.5)

## 2016-06-29 LAB — URINE CULTURE: Culture: NO GROWTH

## 2016-06-29 MED ORDER — LEVOFLOXACIN 750 MG PO TABS
750.0000 mg | ORAL_TABLET | Freq: Every day | ORAL | Status: DC
  Administered 2016-06-29: 750 mg via ORAL
  Filled 2016-06-29: qty 1

## 2016-06-29 MED ORDER — LEVOFLOXACIN 750 MG PO TABS: 750.0000 mg | ORAL_TABLET | Freq: Every day | ORAL | 0 refills | Status: DC

## 2016-06-29 MED ORDER — SODIUM CHLORIDE 0.9 % IV BOLUS (SEPSIS): 250.0000 mL | Freq: Once | INTRAVENOUS | Status: DC

## 2016-06-29 NOTE — Discharge Instructions (Signed)
Follow with Primary MD Glenda Chroman, MD in 3-4 days   Get CBC, CMP, 2 view Chest X ray checked  by Primary MD or SNF MD in 3-4 days ( we routinely change or add medications that can affect your baseline labs and fluid status, therefore we recommend that you get the mentioned basic workup next visit with your PCP, your PCP may decide not to get them or add new tests based on their clinical decision)   Activity: As tolerated with Full fall precautions use walker/cane & assistance as needed   Disposition Home   Diet:   Heart Healthy  Low Carb  For Heart failure patients - Check your Weight same time everyday, if you gain over 2 pounds, or you develop in leg swelling, experience more shortness of breath or chest pain, call your Primary MD immediately. Follow Cardiac Low Salt Diet and 1.5 lit/day fluid restriction.   On your next visit with your primary care physician please Get Medicines reviewed and adjusted.   Please request your Prim.MD to go over all Hospital Tests and Procedure/Radiological results at the follow up, please get all Hospital records sent to your Prim MD by signing hospital release before you go home.   If you experience worsening of your admission symptoms, develop shortness of breath, life threatening emergency, suicidal or homicidal thoughts you must seek medical attention immediately by calling 911 or calling your MD immediately  if symptoms less severe.  You Must read complete instructions/literature along with all the possible adverse reactions/side effects for all the Medicines you take and that have been prescribed to you. Take any new Medicines after you have completely understood and accpet all the possible adverse reactions/side effects.   Do not drive, operate heavy machinery, perform activities at heights, swimming or participation in water activities or provide baby sitting services if your were admitted for syncope or siezures until you have seen by Primary MD  or a Neurologist and advised to do so again.  Do not drive when taking Pain medications.    Do not take more than prescribed Pain, Sleep and Anxiety Medications  Special Instructions: If you have smoked or chewed Tobacco  in the last 2 yrs please stop smoking, stop any regular Alcohol  and or any Recreational drug use.  Wear Seat belts while driving.   Please note  You were cared for by a hospitalist during your hospital stay. If you have any questions about your discharge medications or the care you received while you were in the hospital after you are discharged, you can call the unit and asked to speak with the hospitalist on call if the hospitalist that took care of you is not available. Once you are discharged, your primary care physician will handle any further medical issues. Please note that NO REFILLS for any discharge medications will be authorized once you are discharged, as it is imperative that you return to your primary care physician (or establish a relationship with a primary care physician if you do not have one) for your aftercare needs so that they can reassess your need for medications and monitor your lab values.

## 2016-06-29 NOTE — Progress Notes (Signed)
Pharmacy Antibiotic Note  Jake Gonzalez is a 60 y.o. male admitted on 06/27/2016 with septic shock initially now CAP .  Pharmacy has been consulted for Levaquin dosing. Patient much improved from admission.  Plan: Levaquin 750mg  po q24h F/u renal function, cultures and clinical course  Height: 5\' 8"  (172.7 cm) Weight: 204 lb 2.3 oz (92.6 kg) IBW/kg (Calculated) : 68.4  Temp (24hrs), Avg:98.6 F (37 C), Min:98 F (36.7 C), Max:99.8 F (37.7 C)   Recent Labs Lab 06/24/16 0841 06/27/16 0944 06/27/16 0957 06/27/16 1331 06/27/16 1340 06/27/16 1745 06/27/16 2047 06/28/16 0431 06/29/16 0617  WBC 12.4* 19.6*  --   --   --   --   --  11.4* 4.4  CREATININE 1.10 2.11*  --   --  1.90*  --   --  0.87 0.57*  LATICACIDVEN  --   --  1.68 1.46  --  1.4 0.9  --   --     Estimated Creatinine Clearance: 107.1 mL/min (by C-G formula based on SCr of 0.57 mg/dL (L)).    Allergies  Allergen Reactions  . Other     Steroids cause him to be very emotional and hyper.    Antimicrobials this admission: vanc 10/2 >> 10/4 zosyn 10/2 >> 10/4 Levaquin 10/4>>  Dose adjustments this admission:  10/3 change vancomycin to 1500mg  iv q12h  Microbiology results:  10/2 BCx: ngtd 10/2 UCx: in process 10/2 MRSA PCR: negative  Thank you for allowing pharmacy to be a part of this patient's care.  Isac Sarna, BS Pharm D, [place] Clinical Pharmacist Pager [phone]  06/29/2016 8:47 AM

## 2016-06-29 NOTE — Care Management Note (Signed)
Case Management Note  Patient Details  Name: Jake Gonzalez MRN: [id number] Date of Birth: 01/27/1956  Subjective/Objective:                  Pt admitted with sepsis. Pt chart reviewed for DC needs. Pt is from home. Lives with Family. Has PCP. Has insurance with drug coverage and no difficulty affording medications. He plans to return home with self care.  Action/Plan: No CM needs anticipated.   Expected Discharge Date:    06/29/2016              Expected Discharge Plan:  Home/Self Care  In-House Referral:  NA  Discharge planning Services  CM Consult  Post Acute Care Choice:  NA Choice offered to:  NA  DME Arranged:    DME Agency:     HH Arranged:    HH Agency:     Status of Service:  Completed, signed off  If discussed at [employer] of Stay Meetings, dates discussed:    Additional Comments:  Sherald Barge, RN 06/29/2016, 9:58 AM

## 2016-06-29 NOTE — Discharge Summary (Signed)
Jake Gonzalez [id number] DOB: 1956/07/11 DOA: 06/27/2016  PCP: Glenda Chroman, MD  Admit date: 06/27/2016  Discharge date: 06/29/2016  Admitted From: Home  Disposition:  Home   Recommendations for Outpatient Follow-up:   Follow up with PCP in 1-2 weeks  PCP Please obtain lactic acid, BMP/CBC, 2 view CXR in 1week,  (see Discharge instructions)   PCP Please follow up on the following pending results:   monitor lactic acid level on Glucophage.   Home Health: None   Equipment/Devices: None  Consultations: None Discharge Condition: Fair   CODE STATUS: Full   Diet Recommendation: Heart Healthy Low Carb   Chief Complaint  Patient presents with  . Fever     Brief history of present illness from the day of admission and additional interim summary    This is a pleasant 60 year old gentleman was recently diagnosed with metastatic carcinoma. Pancreas with liver metastases, started on palliative chemotherapy recently with abraxane, Gemza, presented to the hospital with cough and fever ongoing for 2 days, his blood cultures chest x-ray and CT scan abdomen and pelvis were nonacute. He is now afebrile after being kept on empiric antibiotics for 2 days, he is now feeling back to baseline and eager to go home, will be placed on Levaquin for 3 more days with outpatient follow-up with PCP and oncology. Likely had mild bronchitis.     Hospital issues addressed     1. Fever with possible sepsis on admission. Likely due to URI/bronchitis, recently started on chemotherapy for metastatic prostate cancer, blood cultures remain negative, after 2 days of IV vancomycin and Zosyn he is back to baseline and completely symptom free, afebrile, cough has subsided, chest x-ray nonacute, CT scan abdomen and pelvis nonacute with changes  suggestive of metastatic prostate cancer. He is walking in the hallway, case discussed with family bedside, will be placed on 3 more days of oral Levaquin with outpatient follow-up with PCP and his oncologist.   2. 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 Recently started on chemotherapy, follow with oncologist outpatient.  3. ARF due to combination of dehydration and possible early sepsis upon admission. Resolved after hydration, resume home medications, PCP to monitor BMP closely.  4. ED and hypertension. Resume home medications upon discharge.  5. Dyslipidemia. On statin and Lopid. Continue.  6. Chronic back pain, bipolar disorder. Home medications continued unchanged.  7. DM type II. Resume home regimen unchanged follow with PCP for glycemic control. Request PCP to check lactic acid level next visit, if elevated stop Glucophage.  8. Recent light lower extremity DVT. On Eliquis.   Discharge diagnosis     Principal Problem:   Sepsis ([place]) Active Problems:   Septic shock ([facility])   CAD (coronary artery disease)   Hypertension   High cholesterol   Chemotherapy induced nausea and vomiting    Discharge instructions    Discharge Instructions    Discharge instructions    Complete by:  As directed    Follow with Primary MD Dhruv  Barbette Reichmann, MD in 3-4 days   Get CBC, CMP, 2 view Chest X ray checked  by Primary MD or SNF MD in 3-4 days ( we routinely change or add medications that can affect your baseline labs and fluid status, therefore we recommend that you get the mentioned basic workup next visit with your PCP, your PCP may decide not to get them or add new tests based on their clinical decision)   Activity: As tolerated with Full fall precautions use walker/cane & assistance as needed   Disposition Home   Diet: Heart Healthy  Low Carb  For Heart failure patients - Check your Weight same time everyday, if you gain over 2 pounds, or you develop in  leg swelling, experience more shortness of breath or chest pain, call your Primary MD immediately. Follow Cardiac Low Salt Diet and 1.5 lit/day fluid restriction.   On your next visit with your primary care physician please Get Medicines reviewed and adjusted.   Please request your Prim.MD to go over all Hospital Tests and Procedure/Radiological results at the follow up, please get all Hospital records sent to your Prim MD by signing hospital release before you go home.   If you experience worsening of your admission symptoms, develop shortness of breath, life threatening emergency, suicidal or homicidal thoughts you must seek medical attention immediately by calling 911 or calling your MD immediately  if symptoms less severe.  You Must read complete instructions/literature along with all the possible adverse reactions/side effects for all the Medicines you take and that have been prescribed to you. Take any new Medicines after you have completely understood and accpet all the possible adverse reactions/side effects.   Do not drive, operate heavy machinery, perform activities at heights, swimming or participation in water activities or provide baby sitting services if your were admitted for syncope or siezures until you have seen by Primary MD or a Neurologist and advised to do so again.  Do not drive when taking Pain medications.    Do not take more than prescribed Pain, Sleep and Anxiety Medications  Special Instructions: If you have smoked or chewed Tobacco  in the last 2 yrs please stop smoking, stop any regular Alcohol  and or any Recreational drug use.  Wear Seat belts while driving.   Please note  You were cared for by a hospitalist during your hospital stay. If you have any questions about your discharge medications or the care you received while you were in the hospital after you are discharged, you can call the unit and asked to speak with the hospitalist on call if the  hospitalist that took care of you is not available. Once you are discharged, your primary care physician will handle any further medical issues. Please note that NO REFILLS for any discharge medications will be authorized once you are discharged, as it is imperative that you return to your primary care physician (or establish a relationship with a primary care physician if you do not have one) for your aftercare needs so that they can reassess your need for medications and monitor your lab values.   Increase activity slowly    Complete by:  As directed       Discharge Medications     Medication List    TAKE these medications   albuterol 108 (90 Base) MCG/ACT inhaler Commonly known as:  PROVENTIL HFA;VENTOLIN HFA Inhale 2 puffs into the lungs every 6 (six) hours as needed for wheezing or shortness of breath.  atenolol 100 MG tablet Commonly known as:  TENORMIN Take 1 tablet by mouth daily.   atorvastatin 10 MG tablet Commonly known as:  LIPITOR 10 mg daily.   ELIQUIS 5 MG Tabs tablet Generic drug:  apixaban Take 5 mg by mouth at bedtime.   gemfibrozil 600 MG tablet Commonly known as:  LOPID Take 1 tablet by mouth daily.   glimepiride 4 MG tablet Commonly known as:  AMARYL Take 1 tablet by mouth 2 (two) times daily.   levofloxacin 750 MG tablet Commonly known as:  LEVAQUIN Take 1 tablet (750 mg total) by mouth daily. Start taking on:  06/30/2016   lisinopril 20 MG tablet Commonly known as:  PRINIVIL,ZESTRIL Take 1 tablet by mouth daily.   LORazepam 0.5 MG tablet Commonly known as:  ATIVAN Take 1 tablet (0.5 mg total) by mouth every 4 (four) hours as needed for anxiety (and nausea).   metFORMIN 500 MG tablet Commonly known as:  GLUCOPHAGE Take 1 tablet by mouth. 2 tablets every morning and 1 tablet every evening   morphine 15 MG 12 hr tablet Commonly known as:  MS CONTIN Take one 15 mg tab and one 30 mg tab to equal 45 mg every 12 hours.   morphine 30 MG 12 hr  tablet Commonly known as:  MS CONTIN Take one 15 mg tab and one 30 mg tab to equal 45 mg every 12 hours   nystatin powder Commonly known as:  MYCOSTATIN/NYSTOP 1 application daily.   ondansetron 8 MG tablet Commonly known as:  ZOFRAN Take 1 tablet (8 mg total) by mouth 2 (two) times daily as needed (Nausea or vomiting).   pioglitazone 15 MG tablet Commonly known as:  ACTOS Take 1 tablet by mouth daily.   prochlorperazine 10 MG tablet Commonly known as:  COMPAZINE TAKE (1) TABLET EVERY SIX HOURS AS NEEDED FOR NAUSEA AND VOMITING.   Red Yeast Rice 600 MG Caps Take 1 capsule by mouth daily.       Follow-up Information    Glenda Chroman, MD. Schedule an appointment as soon as possible for a visit in 3 day(s).   Specialty:  Internal Medicine Contact information: [place] [address] [phone]        Molli Hazard, MD. Schedule an appointment as soon as possible for a visit in 1 week(s).   Specialties:  Hematology and Oncology, Oncology Contact information: [address] [place] [address] [phone]           Major procedures and Radiology Reports - PLEASE review detailed and final reports thoroughly  -         Dg Chest 2 View  Result Date: 06/27/2016 CLINICAL DATA:  Shortness of breath and left-sided chest pain EXAM: CHEST  2 VIEW COMPARISON:  06/24/2016 FINDINGS: Cardiac shadow is stable. The lungs are well aerated bilaterally. No focal infiltrate or sizable effusion is seen. No bony abnormality is noted. Mild interstitial changes are again seen. IMPRESSION: No acute abnormality noted. Electronically Signed   By: Inez Catalina M.D.   On: 06/27/2016 10:37   Ct Biopsy  Result Date: 06/14/2016 INDICATION: Pancreatic mass.  Liver masses. EXAM: CT BIOPSY MEDICATIONS: None. ANESTHESIA/SEDATION: Fentanyl 125 mcg IV; Versed 2 mg IV Moderate Sedation Time:  20 minutes The patient was continuously monitored during the procedure by the  interventional radiology nurse under my direct supervision. FLUOROSCOPY TIME:  Fluoroscopy Time:  minutes  seconds ( mGy). COMPLICATIONS: None immediate. PROCEDURE: Informed written consent  was obtained from the patient after a thorough discussion of the procedural risks, benefits and alternatives. All questions were addressed. Maximal Sterile Barrier Technique was utilized including caps, mask, sterile gowns, sterile gloves, sterile drape, hand hygiene and skin antiseptic. A timeout was performed prior to the initiation of the procedure. Under CT guidance, a(n) 17 gauge guide needle was advanced into the right lobe liver lesion. Subsequently 3 18 gauge core biopsies were obtained. The guide needle was removed. Post biopsy images demonstrate no hemorrhage. Patient tolerated the procedure well without complication. Vital sign monitoring by nursing staff during the procedure will continue as patient is in the special procedures unit for post procedure observation. FINDINGS: The images document guide needle placement within the right lobe liver lesion. Post biopsy images demonstrate no hemorrhage. IMPRESSION: Successful CT-guided core biopsy of a right lobe liver lesion. Electronically Signed   By: Marybelle Killings M.D.   On: 06/14/2016 16:10   Ct Renal Stone Study  Result Date: 06/27/2016 CLINICAL DATA:  Left flank pain. Stage IV pancreatic adenocarcinoma with first chemotherapy treatment 3 days ago. EXAM: CT ABDOMEN AND PELVIS WITHOUT CONTRAST TECHNIQUE: Multidetector CT imaging of the abdomen and pelvis was performed following the standard protocol without IV contrast. COMPARISON:  05/31/2016 FINDINGS: Lower chest: Mild, partially nodular opacity is partially visualized in the right middle lobe, improved from the prior study which demonstrated more confluent consolidation in this location. 5 mm subpleural nodule in the left lower lobe (series 6, image 5) and 5 mm nodule along the left major fissure (series 6, image  4) are unchanged. 4 mm right lower lobe nodule is also unchanged (series 6, image 13). There also a few additional subpleural nodules in the right lower lobe which measure 2-3 mm in size and may be infectious/inflammatory. No pleural effusion. Similar appearance of fat-containing hernia at the diaphragmatic hiatus with small amount of associated fluid. Coronary artery atherosclerosis. Hepatobiliary: Multiple hypoattenuating liver lesions are again seen, better demonstrated on the prior contrast-enhanced study and not substantially increased in size in the interim, with the largest measuring 3.9 cm in the left hepatic lobe. The gallbladder is mildly hydropic without evidence of wall thickening, pericholecystic inflammation, or biliary dilatation. Pancreas: Enlargement of the distal pancreatic body/tail is stable to minimally more prominent than on the prior study, with discrete measurement of the known underlying mass limited by the lack of IV contrast. Adjacent subcentimeter in the adjacent pancreatic body is also again noted. Spleen: Multiple hypoattenuating splenic masses may have increased in size. Adrenals/Urinary Tract: 1.4 cm right adrenal nodule and 1.0 cm left adrenal nodule, unchanged. No renal calculi, hydronephrosis, or ureteral calculi identified. Unremarkable bladder. Stomach/Bowel: No evidence of bowel obstruction or inflammation. Mild sigmoid colon diverticulosis without evidence of diverticulitis. Unremarkable appendix. Vascular/Lymphatic: Advanced atherosclerosis of the abdominal aorta and its major branch vessels. Enlarged periportal lymph nodes measure up to 1.5 cm in short axis, not significantly changed. 12 mm short axis aortocaval lymph node is unchanged. Reproductive: Unremarkable prostate. Unchanged penile calcifications. Other: Unchanged fat-containing left inguinal hernia. Musculoskeletal: No suspicious lytic or blastic osseous lesions identified. Prior L4-5 PLIF. Advanced L2-3 disc  degeneration. IMPRESSION: 1. No evidence of urinary tract calculi or obstruction. 2. Findings of known metastatic pancreatic cancer with detailed assessment limited by the lack of IV contrast. Stable to minimally increased masslike enlargement of the pancreatic tail, suspected increased size of splenic metastases, similar size of liver metastases, and unchanged lymphadenopathy. 3. Unchanged small adrenal nodules. 4. Improved right middle lobe  aeration with scattered small nodules in both lung bases, indeterminate. Electronically Signed   By: Logan Bores M.D.   On: 06/27/2016 12:44    Micro Results     Recent Results (from the past 240 hour(s))  Culture, blood (Routine X 2)     Status: None (Preliminary result)   Collection Time: 06/27/16  9:45 AM  Result Value Ref Range Status   Specimen Description BLOOD RIGHT ARM DRAWN BY RN R.M.  Final   Special Requests   Final    BOTTLES DRAWN AEROBIC AND ANAEROBIC AEB=8CC ANA=6CC   Culture NO GROWTH 2 DAYS  Final   Report Status PENDING  Incomplete  Culture, blood (Routine X 2)     Status: None (Preliminary result)   Collection Time: 06/27/16  9:54 AM  Result Value Ref Range Status   Specimen Description BLOOD LEFT ANTECUBITAL  Final   Special Requests BOTTLES DRAWN AEROBIC AND ANAEROBIC 10CC EACH  Final   Culture NO GROWTH 2 DAYS  Final   Report Status PENDING  Incomplete  MRSA PCR Screening     Status: None   Collection Time: 06/27/16  4:21 PM  Result Value Ref Range Status   MRSA by PCR NEGATIVE NEGATIVE Final    Comment:        The GeneXpert MRSA Assay (FDA approved for NASAL specimens only), is one component of a comprehensive MRSA colonization surveillance program. It is not intended to diagnose MRSA infection nor to guide or monitor treatment for MRSA infections.     Today   Subjective    Jake Gonzalez today has no headache,no chest abdominal pain,no new weakness tingling or numbness, feels much better wants to go home today.      Objective   Blood pressure 123/78, pulse 98, temperature 98.1 F (36.7 C), temperature source Oral, resp. rate 18, height 5\' 8"  (1.727 m), weight 92.6 kg (204 lb 2.3 oz), SpO2 93 %.   Intake/Output Summary (Last 24 hours) at 06/29/16 0952 Last data filed at 06/29/16 0300  Gross per 24 hour  Intake          1979.17 ml  Output                0 ml  Net          1979.17 ml    Exam Awake Alert, Oriented x 3, No new F.N deficits, Normal affect .AT,PERRAL Supple Neck,No JVD, No cervical lymphadenopathy appriciated.  Symmetrical Chest wall movement, Good air movement bilaterally, CTAB RRR,No Gallops,Rubs or new Murmurs, No Parasternal Heave +ve B.Sounds, Abd Soft, Non tender, No organomegaly appriciated, No rebound -guarding or rigidity. No Cyanosis, Clubbing or edema, No new Rash or bruise   Data Review   CBC w Diff: Lab Results  Component Value Date   WBC 4.4 06/29/2016   HGB 9.7 (L) 06/29/2016   HCT 30.3 (L) 06/29/2016   PLT 88 (L) 06/29/2016   LYMPHOPCT 18 06/29/2016   MONOPCT 2 06/29/2016   EOSPCT 1 06/29/2016   BASOPCT 0 06/29/2016    CMP: Lab Results  Component Value Date   NA 134 (L) 06/29/2016   K 3.6 06/29/2016   CL 100 (L) 06/29/2016   CO2 25 06/29/2016   BUN 10 06/29/2016   CREATININE 0.57 (L) 06/29/2016   PROT 5.9 (L) 06/28/2016   ALBUMIN 2.5 (L) 06/28/2016   BILITOT 1.2 06/28/2016   ALKPHOS 101 06/28/2016   AST 47 (H) 06/28/2016   ALT 31 06/28/2016  .  Total Time in preparing paper work, data evaluation and todays exam - 35 minutes  Thurnell Lose M.D on 06/29/2016 at 9:52 AM  [facility]   Office  [phone]

## 2016-06-29 NOTE — Progress Notes (Signed)
Pt's IV catheter removed and intact. Pt's IV site clean dry and intact. Discharge instructions including medications and follow up appointments were reviewed and discussed with patient and his wife. All questions were answered and no further questions at this time. Pt and his wife verbalized understanding of discharge instructions. Pt in stable condition and in no acute distress at time of discharge. Pt will be escorted by nurse tech.

## 2016-06-30 ENCOUNTER — Other Ambulatory Visit (HOSPITAL_COMMUNITY): Payer: Self-pay | Admitting: *Deleted

## 2016-06-30 DIAGNOSIS — C259 Malignant neoplasm of pancreas, unspecified: Secondary | ICD-10-CM

## 2016-06-30 DIAGNOSIS — C787 Secondary malignant neoplasm of liver and intrahepatic bile duct: Principal | ICD-10-CM

## 2016-07-01 ENCOUNTER — Other Ambulatory Visit (HOSPITAL_COMMUNITY): Payer: Self-pay | Admitting: Emergency Medicine

## 2016-07-01 ENCOUNTER — Encounter: Payer: Self-pay | Admitting: Dietician

## 2016-07-01 ENCOUNTER — Encounter (HOSPITAL_COMMUNITY): Payer: Medicare PPO

## 2016-07-01 ENCOUNTER — Encounter (HOSPITAL_COMMUNITY): Payer: Medicare PPO | Attending: Hematology & Oncology

## 2016-07-01 ENCOUNTER — Encounter (HOSPITAL_BASED_OUTPATIENT_CLINIC_OR_DEPARTMENT_OTHER): Payer: Medicare PPO | Admitting: Hematology & Oncology

## 2016-07-01 VITALS — BP 98/56 | HR 76 | Temp 98.2°F | Resp 20 | Wt 201.4 lb

## 2016-07-01 DIAGNOSIS — C787 Secondary malignant neoplasm of liver and intrahepatic bile duct: Secondary | ICD-10-CM | POA: Insufficient documentation

## 2016-07-01 DIAGNOSIS — E860 Dehydration: Secondary | ICD-10-CM | POA: Diagnosis not present

## 2016-07-01 DIAGNOSIS — C7800 Secondary malignant neoplasm of unspecified lung: Secondary | ICD-10-CM

## 2016-07-01 DIAGNOSIS — Z7901 Long term (current) use of anticoagulants: Secondary | ICD-10-CM

## 2016-07-01 DIAGNOSIS — C259 Malignant neoplasm of pancreas, unspecified: Secondary | ICD-10-CM

## 2016-07-01 DIAGNOSIS — F418 Other specified anxiety disorders: Secondary | ICD-10-CM

## 2016-07-01 DIAGNOSIS — D696 Thrombocytopenia, unspecified: Secondary | ICD-10-CM

## 2016-07-01 DIAGNOSIS — G893 Neoplasm related pain (acute) (chronic): Secondary | ICD-10-CM

## 2016-07-01 DIAGNOSIS — J9600 Acute respiratory failure, unspecified whether with hypoxia or hypercapnia: Secondary | ICD-10-CM

## 2016-07-01 DIAGNOSIS — D6959 Other secondary thrombocytopenia: Secondary | ICD-10-CM

## 2016-07-01 DIAGNOSIS — N179 Acute kidney failure, unspecified: Secondary | ICD-10-CM

## 2016-07-01 DIAGNOSIS — Z7189 Other specified counseling: Secondary | ICD-10-CM

## 2016-07-01 LAB — CBC WITH DIFFERENTIAL/PLATELET
BASOS PCT: 2 %
Basophils Absolute: 0 10*3/uL (ref 0.0–0.1)
EOS ABS: 0 10*3/uL (ref 0.0–0.7)
EOS PCT: 2 %
HCT: 33.1 % — ABNORMAL LOW (ref 39.0–52.0)
Hemoglobin: 10.5 g/dL — ABNORMAL LOW (ref 13.0–17.0)
LYMPHS ABS: 0.8 10*3/uL (ref 0.7–4.0)
Lymphocytes Relative: 42 %
MCH: 27.3 pg (ref 26.0–34.0)
MCHC: 31.7 g/dL (ref 30.0–36.0)
MCV: 86.2 fL (ref 78.0–100.0)
MONOS PCT: 6 %
Monocytes Absolute: 0.1 10*3/uL (ref 0.1–1.0)
NEUTROS PCT: 49 %
Neutro Abs: 1 10*3/uL — ABNORMAL LOW (ref 1.7–7.7)
PLATELETS: 78 10*3/uL — AB (ref 150–400)
RBC: 3.84 MIL/uL — ABNORMAL LOW (ref 4.22–5.81)
RDW: 13.7 % (ref 11.5–15.5)
WBC: 2 10*3/uL — ABNORMAL LOW (ref 4.0–10.5)

## 2016-07-01 LAB — COMPREHENSIVE METABOLIC PANEL
ALBUMIN: 3 g/dL — AB (ref 3.5–5.0)
ALK PHOS: 127 U/L — AB (ref 38–126)
ALT: 40 U/L (ref 17–63)
ANION GAP: 13 (ref 5–15)
AST: 50 U/L — ABNORMAL HIGH (ref 15–41)
BUN: 29 mg/dL — ABNORMAL HIGH (ref 6–20)
CALCIUM: 8.7 mg/dL — AB (ref 8.9–10.3)
CHLORIDE: 96 mmol/L — AB (ref 101–111)
CO2: 24 mmol/L (ref 22–32)
CREATININE: 2.67 mg/dL — AB (ref 0.61–1.24)
GFR, EST AFRICAN AMERICAN: 28 mL/min — AB (ref >60)
GFR, EST NON AFRICAN AMERICAN: 24 mL/min — AB (ref >60)
Glucose, Bld: 156 mg/dL — ABNORMAL HIGH (ref 65–99)
Potassium: 4.3 mmol/L (ref 3.5–5.1)
SODIUM: 133 mmol/L — AB (ref 135–145)
Total Bilirubin: 0.9 mg/dL (ref 0.3–1.2)
Total Protein: 6.9 g/dL (ref 6.5–8.1)

## 2016-07-01 LAB — SAMPLE TO BLOOD BANK

## 2016-07-01 MED ORDER — FENTANYL 50 MCG/HR TD PT72: 50.0000 ug | MEDICATED_PATCH | TRANSDERMAL | 0 refills | Status: DC

## 2016-07-01 MED ORDER — SODIUM CHLORIDE 0.9 % IV SOLN
Freq: Once | INTRAVENOUS | Status: AC
  Administered 2016-07-01: 10:00:00 via INTRAVENOUS

## 2016-07-01 MED ORDER — HALOPERIDOL 0.5 MG PO TABS: 0.5000 mg | ORAL_TABLET | Freq: Three times a day (TID) | ORAL | 0 refills | Status: AC | PRN

## 2016-07-01 MED ORDER — LORAZEPAM 2 MG/ML IJ SOLN
0.5000 mg | Freq: Once | INTRAMUSCULAR | Status: AC
  Administered 2016-07-01: 0.5 mg via INTRAVENOUS
  Filled 2016-07-01: qty 1

## 2016-07-01 MED ORDER — DEXAMETHASONE 4 MG PO TABS: 4.0000 mg | ORAL_TABLET | Freq: Two times a day (BID) | ORAL | 1 refills | Status: AC

## 2016-07-01 MED ORDER — ONDANSETRON HCL 8 MG PO TABS: 8.0000 mg | ORAL_TABLET | Freq: Three times a day (TID) | ORAL | 2 refills | Status: DC

## 2016-07-01 MED ORDER — SODIUM CHLORIDE 0.9 % IV SOLN
Freq: Once | INTRAVENOUS | Status: AC
  Administered 2016-07-01: 11:00:00 via INTRAVENOUS
  Filled 2016-07-01: qty 4

## 2016-07-01 NOTE — Patient Instructions (Signed)
[place] at [facility] Discharge Instructions  RECOMMENDATIONS MADE BY THE CONSULTANT AND ANY TEST RESULTS WILL BE SENT TO YOUR REFERRING PHYSICIAN.  Received hydration and antiemetics today. Follow-up as scheduled. Call clinic for any questions or concerns  Thank you for choosing [place] at [facility] to provide your oncology and hematology care.  To afford each patient quality time with our provider, please arrive at least 15 minutes before your scheduled appointment time.   Beginning January 23rd 2017 lab work for the [facility] will be done in the  Main lab at [facility] on 1st floor. If you have a lab appointment with the [facility] please come in thru the  Main Entrance and check in at the main information desk  You need to re-schedule your appointment should you arrive 10 or more minutes late.  We strive to give you quality time with our providers, and arriving late affects you and other patients whose appointments are after yours.  Also, if you no show three or more times for appointments you may be dismissed from the clinic at the providers discretion.     Again, thank you for choosing [facility].  Our hope is that these requests will decrease the amount of time that you wait before being seen by our physicians.       _____________________________________________________________  Should you have questions after your visit to [facility], please contact our office at [phone] between the hours of 8:30 a.m. and 4:30 p.m.  Voicemails left after 4:30 p.m. will not be returned until the following business day.  For prescription refill requests, have your pharmacy contact our office.         Resources For Cancer Patients and their Caregivers ? American Cancer Society: Can assist with transportation, wigs, general needs, runs Look Good Feel Better.        506 519 7943 ? Cancer Care: Provides  financial assistance, online support groups, medication/co-pay assistance.  1-800-813-HOPE 605-540-5275) ? [place] Assists [place] cancer patients and their families through emotional , educational and financial support.  9707468527 ? [place] DSS Where to apply for food stamps, Medicaid and utility assistance. 610-539-5850 ? RCATS: Transportation to medical appointments. 979-452-5976 ? Social Security Administration: May apply for disability if have a Stage IV cancer. 7071900085 309-586-4981 ? [employer], Disability and Transit Services: Assists with nutrition, care and transit needs. [place] Support Programs: @10RELATIVEDAYS @ > Cancer Support Group  2nd Tuesday of the month 1pm-2pm, Journey Room  > Creative Journey  3rd Tuesday of the month 1130am-1pm, Journey Room  > Look Good Feel Better  1st Wednesday of the month 10am-12 noon, Journey Room (Call [place] to register [phone])

## 2016-07-01 NOTE — Progress Notes (Signed)
[id number] Labs reviewed with Dr. Whitney Muse and chemo tx to be held today. Pt will receive 1 and 1/2 liters of NS over 5 hrs today per MD orders                                                     Nolene Ebbs tolerated hydration and antiemetics per Dr. Donald Pore orders well without incident. Pt did experience several episodes of nausea and vomiting but antiemetics were effective. VSS upon discharge. Pt discharged via wheelchair in stable condition with wife

## 2016-07-01 NOTE — Patient Instructions (Signed)
[place] at [facility] Discharge Instructions  RECOMMENDATIONS MADE BY THE CONSULTANT AND ANY TEST RESULTS WILL BE SENT TO YOUR REFERRING PHYSICIAN.   CMP and CBC diff on Monday - wait for lab results  Zofran, Dexamethasone, Haldol called to [facility]  Zofran - take 1 tablet EVERY 8 hours, Dexamethasone 4mg  - take 1 in the am and 1 in the pm every day. Haldol 0.5mg  tablet - take one tablet three times a day as needed for nausea.  Thank you for choosing [place] at [facility] to provide your oncology and hematology care.  To afford each patient quality time with our provider, please arrive at least 15 minutes before your scheduled appointment time.   Beginning January 23rd 2017 lab work for the [facility] will be done in the  Main lab at [facility] on 1st floor. If you have a lab appointment with the [facility] please come in thru the  Main Entrance and check in at the main information desk  You need to re-schedule your appointment should you arrive 10 or more minutes late.  We strive to give you quality time with our providers, and arriving late affects you and other patients whose appointments are after yours.  Also, if you no show three or more times for appointments you may be dismissed from the clinic at the providers discretion.     Again, thank you for choosing [facility].  Our hope is that these requests will decrease the amount of time that you wait before being seen by our physicians.       _____________________________________________________________  Should you have questions after your visit to [facility], please contact our office at [phone] between the hours of 8:30 a.m. and 4:30 p.m.  Voicemails left after 4:30 p.m. will not be returned until the following business day.  For prescription refill requests, have your pharmacy contact our office.         Resources For Cancer Patients and  their Caregivers ? American Cancer Society: Can assist with transportation, wigs, general needs, runs Look Good Feel Better.        (661)262-6679 ? Cancer Care: Provides financial assistance, online support groups, medication/co-pay assistance.  1-800-813-HOPE 7158477852) ? [place] Assists [place] cancer patients and their families through emotional , educational and financial support.  918-650-3319 ? [place] DSS Where to apply for food stamps, Medicaid and utility assistance. (661)362-1948 ? RCATS: Transportation to medical appointments. (705)152-3273 ? Social Security Administration: May apply for disability if have a Stage IV cancer. 309 151 9928 (670) 585-6899 ? [employer], Disability and Transit Services: Assists with nutrition, care and transit needs. [place] Support Programs: @10RELATIVEDAYS @ > Cancer Support Group  2nd Tuesday of the month 1pm-2pm, Journey Room  > Creative Journey  3rd Tuesday of the month 1130am-1pm, Journey Room  > Look Good Feel Better  1st Wednesday of the month 10am-12 noon, Journey Room (Call [place] to register [phone])

## 2016-07-01 NOTE — Progress Notes (Signed)
MD contacted RD and asked for pt to be seen due to him being in ARF due to severe dehydration and vomiting that may be related to meal choices  He has metastatic pancreatic cancer  Contacted Pt by visiting during IVF infusion.   Wt Readings from Last 10 Encounters:  07/01/16 201 lb 6.4 oz (91.4 kg)  06/28/16 204 lb 2.3 oz (92.6 kg)  06/24/16 204 lb 12.8 oz (92.9 kg)  06/21/16 204 lb 12.8 oz (92.9 kg)  06/14/16 208 lb (94.3 kg)  06/07/16 210 lb 12.8 oz (95.6 kg)  01/18/16 229 lb (103.9 kg)  12/28/15 220 lb (99.8 kg)   Patient weight has decreased by a few lbs in the last few days.   Pt was seen inpatient 3 days ago and at that time stated he was eating well and had a strong appetite. He said that his n/v were both resolved at that time. RD gave education and accompanying handouts on managing nausea and vomiting in case he were to run into those symptoms again.   Pt says that he felt good when he was discharged from hospital, but quickly declined. He reports severe erratic vomiting. In fact, he had a couple of episodes of vomiting during our encounter. He says these instances are unpredictable and does not note any pattern. He states he may vomit immediately after he eats or hours after he eats. Subjectively, Neither the family nor the patient believe his vomiting is related to his PO intake.   RD went through dietary recall. He reports that yesterday all he ate in the morning was a half a [place] sandwich with Lettuce and tomato and the he ate the other half before bed. Denied adding any condiments. The day before that he had an egg sandwich and hours later suffered from vomiting.   He had tried some Ensure supplements, but he could not tolerate them due to taste. He is provided with an Ensure Clear supplement to see if this is better tolerated.   He says he has no problem drinking water and drinks constantly throughout the day.   Today, when he threw up during his appointment, he states he  had eaten just a wafer cookie before hand and, hours earlier, animal crackers. Neither of these foods are typically causes of n/v due to them being almost exclusively simple sugars. These are items that WOULD be suggested for n/v.   Question if his vomiting is more related to his chemo, other medications, or just his general malignancy. Given the type of cancer he has, question some sort of partial obstruction that would allow thin liquids such as water to pass, but nothing else.   Patient discussed with MD. She reports he had vomiting before treatment began and that at this time there is no imaging to suggest blockage.   RD will continue to monitor.   Burtis Junes RD, LDN, [facility] Pager: [phone] 07/01/2016 2:49 PM

## 2016-07-01 NOTE — Progress Notes (Signed)
[place]  Progress Note  Patient Care Team: Glenda Chroman, MD as PCP - General (Internal Medicine) Dian Situ, MD (Pain Medicine)  CHIEF COMPLAINTS/PURPOSE OF CONSULTATION:  Abdominal pain Pancreatic mass Liver mass Lymphadenopathy Splenic mass Bilateral adrenal nodules Multiple pulmonary nodules    Pancreatic cancer metastasized to liver ([place])   06/07/2016 Tumor Marker    CA 19-9  75,141 U/ml      06/14/2016 Initial Biopsy    CT biopspy of R liver lesion, 3 18 gauge core biopsies obtained       06/14/2016 Pathology Results    Liver, needle/core biopsy METASTATIC ADENOCARCINOMA Microscopic Comment The adenocarcinoma with intra cellular mucin stains positive for ck7, focally positive for cdx2, ck20, negative for TTF-1, prostein, and pax8. The morphology and staining pattern favor the adenocarcinoma is pancreatobiliary origin.        HISTORY OF PRESENTING ILLNESS:  Jake Gonzalez 60 y.o. male is here for further follow-up of stage IV adenocarcinoma of the pancreas.  He continues to have problems with intermittent nausea and vomiting.   Mr. Stroder is accompanied by his wife and two sons.   He believes his abdominal pain is secondary to vomiting and "all that chemo together and the cancer itself". "I just really hate throwing up". He is taking zofran in the morning and at night. He has enough morphine pills to last him until next week. He feels as though he needs more than the prescribed amount to manage his pain.   He has not been eating well. "I know I need to eat". He has lost weight. His wife notes he is afraid to eat because he is afraid he'll get sick. He asks if Ensure protein shakes will help.  He reports dry mouth this weekend. "I couldn't hardly open my mouth. It would hurt to swallow".   He sleeps pretty well at night, except for when he gets nauseated.   He asks how his cancer got to Stage IV without anyone knowing. He did not know anyone  with pancreatic cancer before he was diagnosed. He did not know much about the pancreas.  He has been urinating. He has been moving his bowels. He denies any nausea at this time.    MEDICAL HISTORY:  Past Medical History:  Diagnosis Date  . CAD (coronary artery disease)   . Clotting disorder ([place])   . COPD (chronic obstructive pulmonary disease) ([place])   . Diabetes mellitus without complication ([place])   . High cholesterol   . Hypertension   . Myocardial infarction   . Pancreatic cancer [facility])     SURGICAL HISTORY: Past Surgical History:  Procedure Laterality Date  . BACK SURGERY    . CARDIAC SURGERY     cardiac stents  . hemilaminectomy  2003  . HERNIA REPAIR    . LUMBAR FUSION  1997   L4-5 X2  . TONSILLECTOMY      SOCIAL HISTORY: Social History   Social History  . Marital status: Married    Spouse name: N/A  . Number of children: N/A  . Years of education: N/A   Occupational History  . Not on file.   Social History Main Topics  . Smoking status: Former Smoker    Packs/day: 2.00    Years: 35.00    Quit date: 11/21/2004  . Smokeless tobacco: Never Used  . Alcohol use No  . Drug use: No  . Sexual activity: Not on file     Comment:  married- 2 sons   Other Topics Concern  . Not on file   Social History Narrative  . No narrative on file  Jake Gonzalez's been married since 2001 to his second wife. He has two children. 4 great grandchildren.  He worked in [employer].  Patient does not consume alcohol.  Patient quit smoking Feb. 6, 2006  FAMILY HISTORY: Family History  Problem Relation Age of Onset  . Heart disease Brother     prior to age 37  . Stroke Brother   . Hypertension Brother   . Cancer Mother     ovarian  . Alcoholism Father   . Cancer Maternal Uncle     stomach  . Other Brother     MVA  Mother had ovarian cancer and stomach cancer; deceased at 88 years old Uncle possibly had stomach cancer Father was an alcoholic and died at 64 years old. Most of  father's family was alcoholic. Brother had heart attack  ALLERGIES:  is allergic to other.  MEDICATIONS:  Current Outpatient Prescriptions  Medication Sig Dispense Refill  . albuterol (PROVENTIL HFA;VENTOLIN HFA) 108 (90 Base) MCG/ACT inhaler Inhale 2 puffs into the lungs every 6 (six) hours as needed for wheezing or shortness of breath.    Marland Kitchen apixaban (ELIQUIS) 5 MG TABS tablet Take 5 mg by mouth at bedtime.    Marland Kitchen atenolol (TENORMIN) 100 MG tablet Take 1 tablet by mouth daily.    Marland Kitchen atorvastatin (LIPITOR) 10 MG tablet 10 mg daily.     . fentaNYL (DURAGESIC - DOSED MCG/HR) 50 MCG/HR Place 1 patch (50 mcg total) onto the skin every 3 (three) days. 10 patch 0  . gemfibrozil (LOPID) 600 MG tablet Take 1 tablet by mouth daily.    Marland Kitchen glimepiride (AMARYL) 4 MG tablet Take 1 tablet by mouth 2 (two) times daily.    Marland Kitchen levofloxacin (LEVAQUIN) 750 MG tablet Take 1 tablet (750 mg total) by mouth daily. 3 tablet 0  . lisinopril (PRINIVIL,ZESTRIL) 20 MG tablet Take 1 tablet by mouth daily.    Marland Kitchen LORazepam (ATIVAN) 0.5 MG tablet Take 1 tablet (0.5 mg total) by mouth every 4 (four) hours as needed for anxiety (and nausea). 60 tablet 0  . metFORMIN (GLUCOPHAGE) 500 MG tablet Take 1 tablet by mouth. 2 tablets every morning and 1 tablet every evening    . morphine (MS CONTIN) 15 MG 12 hr tablet Take one 15 mg tab and one 30 mg tab to equal 45 mg every 12 hours. 60 tablet 0  . morphine (MS CONTIN) 30 MG 12 hr tablet Take one 15 mg tab and one 30 mg tab to equal 45 mg every 12 hours 60 tablet 0  . nystatin (MYCOSTATIN/NYSTOP) powder 1 application daily.    . ondansetron (ZOFRAN) 8 MG tablet Take 1 tablet (8 mg total) by mouth 2 (two) times daily as needed (Nausea or vomiting). 30 tablet 1  . pioglitazone (ACTOS) 15 MG tablet Take 1 tablet by mouth daily.    . prochlorperazine (COMPAZINE) 10 MG tablet TAKE (1) TABLET EVERY SIX HOURS AS NEEDED FOR NAUSEA AND VOMITING. 30 tablet 0  . Red Yeast Rice 600 MG CAPS Take 1  capsule by mouth daily.     No current facility-administered medications for this visit.    Facility-Administered Medications Ordered in Other Visits  Medication Dose Route Frequency Provider Last Rate Last Dose  . 0.9 %  sodium chloride infusion   Intravenous Once Patrici Ranks, MD  Review of Systems  Constitutional: Positive for malaise/fatigue and weight loss (3 lbs since 06/28/2016).  HENT: Positive for sore throat. Negative for ear pain, hearing loss and tinnitus.        Sore throat secondary to vomiting  Eyes: Negative.   Respiratory: Negative.  Negative for cough, hemoptysis, sputum production, shortness of breath and wheezing.   Cardiovascular: Negative.  Negative for chest pain, palpitations, orthopnea, claudication, leg swelling and PND.  Gastrointestinal: Positive for nausea and vomiting. Negative for blood in stool, heartburn and melena.  Genitourinary: Negative.  Negative for dysuria, flank pain, frequency, hematuria and urgency.  Musculoskeletal: Positive for back pain and joint pain.  Skin: Negative.   Neurological: Positive for tingling and weakness. Negative for headaches.       Tingling in hands and feet  Endo/Heme/Allergies: Negative.   Psychiatric/Behavioral: Negative for depression, hallucinations, memory loss, substance abuse and suicidal ideas. The patient is nervous/anxious. The patient does not have insomnia.   All other systems reviewed and are negative. 14 point ROS was done and is otherwise as detailed above or in HPI   PHYSICAL EXAMINATION: ECOG PERFORMANCE STATUS: 1 - Symptomatic but completely ambulatory  Vitals with BMI 07/01/2016  Height   Weight 201 lbs 6 oz  BMI   Systolic 77  Diastolic 43  Pulse 69  Respirations 20    Physical Exam  Constitutional: He is oriented to person, place, and time and well-developed, well-nourished, and in no distress.  HENT:  Head: Normocephalic and atraumatic.  Nose: Nose normal.  Mouth/Throat:  Oropharynx is clear and moist. No oropharyngeal exudate.  Eyes: Conjunctivae and EOM are normal. Pupils are equal, round, and reactive to light. Right eye exhibits no discharge. Left eye exhibits no discharge. No scleral icterus.  Neck: Normal range of motion. Neck supple. No tracheal deviation present. No thyromegaly present.  Cardiovascular: Normal rate, regular rhythm and normal heart sounds.  Exam reveals no gallop and no friction rub.   No murmur heard. Left leg swollen due to blood clot  Pulmonary/Chest: Effort normal and breath sounds normal. He has no wheezes. He has no rales.  Abdominal: Soft. Bowel sounds are normal. He exhibits no distension and no mass. There is tenderness. There is no rebound and no guarding.  Musculoskeletal: Normal range of motion. He exhibits no edema.  LLE > RLE  Lymphadenopathy:    He has no cervical adenopathy.  Neurological: He is alert and oriented to person, place, and time. He has normal reflexes. No cranial nerve deficit. Gait normal. Coordination normal.  Skin: Skin is warm and dry. No rash noted.  Psychiatric: Mood, memory, affect and judgment normal.  Nursing note and vitals reviewed.   LABORATORY DATA:  I have reviewed the data as listed Results for ESIAS, MORY (MRN [id number])   Ref. Range 07/01/2016 08:43  Sodium Latest Ref Range: 135 - 145 mmol/L 133 (L)  Potassium Latest Ref Range: 3.5 - 5.1 mmol/L 4.3  Chloride Latest Ref Range: 101 - 111 mmol/L 96 (L)  CO2 Latest Ref Range: 22 - 32 mmol/L 24  BUN Latest Ref Range: 6 - 20 mg/dL 29 (H)  Creatinine Latest Ref Range: 0.61 - 1.24 mg/dL 2.67 (H)  Calcium Latest Ref Range: 8.9 - 10.3 mg/dL 8.7 (L)  EGFR (Non-African Amer.) Latest Ref Range: >60 mL/min 24 (L)  EGFR (African American) Latest Ref Range: >60 mL/min 28 (L)  Glucose Latest Ref Range: 65 - 99 mg/dL 156 (H)  Anion gap Latest Ref Range: 5 -  15  13  Alkaline Phosphatase Latest Ref Range: 38 - 126 U/L 127 (H)  Albumin Latest Ref  Range: 3.5 - 5.0 g/dL 3.0 (L)  AST Latest Ref Range: 15 - 41 U/L 50 (H)  ALT Latest Ref Range: 17 - 63 U/L 40  Total Protein Latest Ref Range: 6.5 - 8.1 g/dL 6.9  Total Bilirubin Latest Ref Range: 0.3 - 1.2 mg/dL 0.9  WBC Latest Ref Range: 4.0 - 10.5 K/uL 2.0 (L)  RBC Latest Ref Range: 4.22 - 5.81 MIL/uL 3.84 (L)  Hemoglobin Latest Ref Range: 13.0 - 17.0 g/dL 10.5 (L)  HCT Latest Ref Range: 39.0 - 52.0 % 33.1 (L)  MCV Latest Ref Range: 78.0 - 100.0 fL 86.2  MCH Latest Ref Range: 26.0 - 34.0 pg 27.3  MCHC Latest Ref Range: 30.0 - 36.0 g/dL 31.7  RDW Latest Ref Range: 11.5 - 15.5 % 13.7  Platelets Latest Ref Range: 150 - 400 K/uL 78 (L)  Neutrophils Latest Units: % 49  Lymphocytes Latest Units: % 42  Monocytes Relative Latest Units: % 6  Eosinophil Latest Units: % 2  Basophil Latest Units: % 2  NEUT# Latest Ref Range: 1.7 - 7.7 K/uL 1.0 (L)  Lymphocyte # Latest Ref Range: 0.7 - 4.0 K/uL 0.8  Monocyte # Latest Ref Range: 0.1 - 1.0 K/uL 0.1  Eosinophils Absolute Latest Ref Range: 0.0 - 0.7 K/uL 0.0  Basophils Absolute Latest Ref Range: 0.0 - 0.1 K/uL 0.0  Blood Bank Specimen Unknown SAMPLE AVAILABLE .Marland KitchenMarland Kitchen  Sample Expiration Unknown 07/02/2016    RADIOGRAPHIC STUDIES: I have personally reviewed the radiological images as listed and agreed with the findings in the report. No results found.  PATHOLOGY   ASSESSMENT & PLAN: Stage IV adenocarcinoma of the pancreas Liver metastases Pulmonary metastases Abdominal/Back pain Weight loss LLE DVT Chronic Anticoagulation HX chronic back pain Anxiety  Cancer related pain ARF secondary to dehydration Thrombocytopenia secondary to chemotharpy  We will hydrate today. Push fluids through the weekend. Recheck BMP on Monday and if renal failure persists will admit for further evaluation. May need to d/c his lisinopril.  Based upon his platelet count I explained to him that he cannot receive chemotherapy today, in addition to his ARF. Moving  forward if we get to do additional therapy I will dose reduce him  Laboratory studies were reviewed in detail with the patient and his family. Results are noted above.   I spoke with the patient and his family about his diagnosis in detail. His sons are accepting, the patient and his wife are currently struggling. He and his family must work out his end of life issues.   He was given instructions on how to take his nausea medications on a standing basis. I have also written the patient a prescription for haldol, he was advised to use this when he has episodes of vomiting.   His pain medications need to be worked out moving forward. Either increase his long acting oral or convert to duragesic which may be a better choice given his vomiting.   He is scheduled to follow up with Kirby Crigler, PAC on 07/08/2016.  ORDERS PLACED FOR THIS ENCOUNTER: No orders of the defined types were placed in this encounter.  Orders Placed This Encounter  Procedures  . Comprehensive metabolic panel    Standing Status:   Future    Standing Expiration Date:   07/01/2017  . CBC with Differential    Standing Status:   Future    Standing  Expiration Date:   07/01/2017     MEDICATIONS PRESCRIBED THIS ENCOUNTER: No orders of the defined types were placed in this encounter.  Meds ordered this encounter  Medications  . dexamethasone (DECADRON) 4 MG tablet    Sig: Take 1 tablet (4 mg total) by mouth 2 (two) times daily with a meal.    Dispense:  60 tablet    Refill:  1  . haloperidol (HALDOL) 0.5 MG tablet    Sig: Take 1 tablet (0.5 mg total) by mouth every 8 (eight) hours as needed (nausea).    Dispense:  90 tablet    Refill:  0  . ondansetron (ZOFRAN) 8 MG tablet    Sig: Take 1 tablet (8 mg total) by mouth every 8 (eight) hours.    Dispense:  30 tablet    Refill:  2   All questions were answered. The patient knows to call the clinic with any problems, questions or concerns.  This document serves as a  record of services personally performed by Ancil Linsey, MD. It was created on her behalf by Arlyce Harman, a trained medical scribe. The creation of this record is based on the scribe's personal observations and the provider's statements to them. This document has been checked and approved by the attending provider.  I have reviewed the above documentation for accuracy and completeness, and I agree with the above.  This note was electronically signed.  Molli Hazard, MD   07/01/2016 1:10 PM

## 2016-07-02 LAB — CULTURE, BLOOD (ROUTINE X 2)
Culture: NO GROWTH
Culture: NO GROWTH

## 2016-07-03 ENCOUNTER — Encounter (HOSPITAL_COMMUNITY): Payer: Self-pay | Admitting: Hematology & Oncology

## 2016-07-04 ENCOUNTER — Other Ambulatory Visit: Payer: Self-pay

## 2016-07-04 ENCOUNTER — Encounter (HOSPITAL_COMMUNITY): Payer: Medicare PPO

## 2016-07-04 ENCOUNTER — Other Ambulatory Visit (HOSPITAL_COMMUNITY): Payer: Self-pay

## 2016-07-04 ENCOUNTER — Inpatient Hospital Stay (HOSPITAL_COMMUNITY)
Admission: EM | Admit: 2016-07-04 | Discharge: 2016-07-06 | DRG: 315 | Disposition: A | Discharge status: Hospice | Payer: Medicare PPO | Attending: Internal Medicine | Admitting: Internal Medicine

## 2016-07-04 ENCOUNTER — Encounter (HOSPITAL_BASED_OUTPATIENT_CLINIC_OR_DEPARTMENT_OTHER): Payer: Medicare PPO

## 2016-07-04 ENCOUNTER — Other Ambulatory Visit (HOSPITAL_COMMUNITY): Payer: Self-pay | Admitting: Pharmacist

## 2016-07-04 ENCOUNTER — Encounter (HOSPITAL_COMMUNITY): Payer: Self-pay | Admitting: *Deleted

## 2016-07-04 ENCOUNTER — Emergency Department (HOSPITAL_COMMUNITY): Payer: Medicare PPO

## 2016-07-04 VITALS — BP 88/54 | HR 84 | Temp 98.6°F | Resp 16

## 2016-07-04 DIAGNOSIS — Z7189 Other specified counseling: Secondary | ICD-10-CM | POA: Diagnosis not present

## 2016-07-04 DIAGNOSIS — C251 Malignant neoplasm of body of pancreas: Secondary | ICD-10-CM | POA: Diagnosis not present

## 2016-07-04 DIAGNOSIS — Z86718 Personal history of other venous thrombosis and embolism: Secondary | ICD-10-CM

## 2016-07-04 DIAGNOSIS — E860 Dehydration: Secondary | ICD-10-CM

## 2016-07-04 DIAGNOSIS — I10 Essential (primary) hypertension: Secondary | ICD-10-CM | POA: Diagnosis present

## 2016-07-04 DIAGNOSIS — I82402 Acute embolism and thrombosis of unspecified deep veins of left lower extremity: Secondary | ICD-10-CM | POA: Diagnosis present

## 2016-07-04 DIAGNOSIS — M6281 Muscle weakness (generalized): Secondary | ICD-10-CM

## 2016-07-04 DIAGNOSIS — Z66 Do not resuscitate: Secondary | ICD-10-CM | POA: Diagnosis present

## 2016-07-04 DIAGNOSIS — C259 Malignant neoplasm of pancreas, unspecified: Secondary | ICD-10-CM | POA: Diagnosis present

## 2016-07-04 DIAGNOSIS — I9589 Other hypotension: Secondary | ICD-10-CM

## 2016-07-04 DIAGNOSIS — E11649 Type 2 diabetes mellitus with hypoglycemia without coma: Secondary | ICD-10-CM | POA: Diagnosis present

## 2016-07-04 DIAGNOSIS — R2681 Unsteadiness on feet: Secondary | ICD-10-CM

## 2016-07-04 DIAGNOSIS — Z515 Encounter for palliative care: Secondary | ICD-10-CM | POA: Diagnosis present

## 2016-07-04 DIAGNOSIS — R7989 Other specified abnormal findings of blood chemistry: Secondary | ICD-10-CM | POA: Diagnosis present

## 2016-07-04 DIAGNOSIS — D649 Anemia, unspecified: Secondary | ICD-10-CM | POA: Diagnosis present

## 2016-07-04 DIAGNOSIS — D709 Neutropenia, unspecified: Secondary | ICD-10-CM | POA: Diagnosis present

## 2016-07-04 DIAGNOSIS — R0989 Other specified symptoms and signs involving the circulatory and respiratory systems: Secondary | ICD-10-CM | POA: Diagnosis present

## 2016-07-04 DIAGNOSIS — I2510 Atherosclerotic heart disease of native coronary artery without angina pectoris: Secondary | ICD-10-CM | POA: Diagnosis present

## 2016-07-04 DIAGNOSIS — C787 Secondary malignant neoplasm of liver and intrahepatic bile duct: Secondary | ICD-10-CM | POA: Diagnosis present

## 2016-07-04 DIAGNOSIS — D701 Agranulocytosis secondary to cancer chemotherapy: Secondary | ICD-10-CM

## 2016-07-04 DIAGNOSIS — K8681 Exocrine pancreatic insufficiency: Secondary | ICD-10-CM | POA: Diagnosis present

## 2016-07-04 DIAGNOSIS — I252 Old myocardial infarction: Secondary | ICD-10-CM

## 2016-07-04 DIAGNOSIS — I959 Hypotension, unspecified: Principal | ICD-10-CM

## 2016-07-04 DIAGNOSIS — Z7901 Long term (current) use of anticoagulants: Secondary | ICD-10-CM | POA: Diagnosis not present

## 2016-07-04 DIAGNOSIS — E7800 Pure hypercholesterolemia, unspecified: Secondary | ICD-10-CM | POA: Diagnosis present

## 2016-07-04 DIAGNOSIS — B370 Candidal stomatitis: Secondary | ICD-10-CM | POA: Diagnosis present

## 2016-07-04 DIAGNOSIS — Z7984 Long term (current) use of oral hypoglycemic drugs: Secondary | ICD-10-CM | POA: Diagnosis not present

## 2016-07-04 DIAGNOSIS — Z87891 Personal history of nicotine dependence: Secondary | ICD-10-CM

## 2016-07-04 DIAGNOSIS — R112 Nausea with vomiting, unspecified: Secondary | ICD-10-CM | POA: Diagnosis present

## 2016-07-04 DIAGNOSIS — E119 Type 2 diabetes mellitus without complications: Secondary | ICD-10-CM

## 2016-07-04 DIAGNOSIS — D638 Anemia in other chronic diseases classified elsewhere: Secondary | ICD-10-CM | POA: Diagnosis present

## 2016-07-04 DIAGNOSIS — E162 Hypoglycemia, unspecified: Secondary | ICD-10-CM | POA: Diagnosis not present

## 2016-07-04 DIAGNOSIS — T451X5A Adverse effect of antineoplastic and immunosuppressive drugs, initial encounter: Secondary | ICD-10-CM | POA: Diagnosis present

## 2016-07-04 DIAGNOSIS — Z981 Arthrodesis status: Secondary | ICD-10-CM

## 2016-07-04 DIAGNOSIS — N179 Acute kidney failure, unspecified: Secondary | ICD-10-CM | POA: Diagnosis present

## 2016-07-04 DIAGNOSIS — Z79899 Other long term (current) drug therapy: Secondary | ICD-10-CM

## 2016-07-04 LAB — CBC WITH DIFFERENTIAL/PLATELET
BASOS PCT: 2 %
Basophils Absolute: 0.1 10*3/uL (ref 0.0–0.1)
EOS PCT: 1 %
Eosinophils Absolute: 0 10*3/uL (ref 0.0–0.7)
HEMATOCRIT: 35 % — AB (ref 39.0–52.0)
HEMOGLOBIN: 11.3 g/dL — AB (ref 13.0–17.0)
Lymphocytes Relative: 53 %
Lymphs Abs: 1.6 10*3/uL (ref 0.7–4.0)
MCH: 27.7 pg (ref 26.0–34.0)
MCHC: 32.3 g/dL (ref 30.0–36.0)
MCV: 85.8 fL (ref 78.0–100.0)
MONO ABS: 1.1 10*3/uL — AB (ref 0.1–1.0)
MONOS PCT: 34 %
NEUTROS PCT: 10 %
Neutro Abs: 0.3 10*3/uL — ABNORMAL LOW (ref 1.7–7.7)
Platelets: 161 10*3/uL (ref 150–400)
RBC: 4.08 MIL/uL — AB (ref 4.22–5.81)
RDW: 14.2 % (ref 11.5–15.5)
WBC: 3.1 10*3/uL — AB (ref 4.0–10.5)

## 2016-07-04 LAB — COMPREHENSIVE METABOLIC PANEL
ALT: 30 U/L (ref 17–63)
AST: 48 U/L — AB (ref 15–41)
Albumin: 2.9 g/dL — ABNORMAL LOW (ref 3.5–5.0)
Alkaline Phosphatase: 160 U/L — ABNORMAL HIGH (ref 38–126)
Anion gap: 11 (ref 5–15)
BUN: 42 mg/dL — AB (ref 6–20)
CHLORIDE: 98 mmol/L — AB (ref 101–111)
CO2: 26 mmol/L (ref 22–32)
CREATININE: 2.38 mg/dL — AB (ref 0.61–1.24)
Calcium: 8.8 mg/dL — ABNORMAL LOW (ref 8.9–10.3)
GFR, EST AFRICAN AMERICAN: 32 mL/min — AB (ref >60)
GFR, EST NON AFRICAN AMERICAN: 28 mL/min — AB (ref >60)
Glucose, Bld: 118 mg/dL — ABNORMAL HIGH (ref 65–99)
POTASSIUM: 4.1 mmol/L (ref 3.5–5.1)
SODIUM: 135 mmol/L (ref 135–145)
Total Bilirubin: 0.7 mg/dL (ref 0.3–1.2)
Total Protein: 6.4 g/dL — ABNORMAL LOW (ref 6.5–8.1)

## 2016-07-04 LAB — I-STAT CG4 LACTIC ACID, ED: Lactic Acid, Venous: 1.43 mmol/L (ref 0.5–1.9)

## 2016-07-04 LAB — URINALYSIS, ROUTINE W REFLEX MICROSCOPIC
BILIRUBIN URINE: NEGATIVE
GLUCOSE, UA: 100 mg/dL — AB
Hgb urine dipstick: NEGATIVE
KETONES UR: NEGATIVE mg/dL
Leukocytes, UA: NEGATIVE
Nitrite: NEGATIVE
PH: 5.5 (ref 5.0–8.0)
Protein, ur: NEGATIVE mg/dL
Specific Gravity, Urine: 1.02 (ref 1.005–1.030)

## 2016-07-04 LAB — I-STAT CHEM 8, ED
BUN: 37 mg/dL — AB (ref 6–20)
CALCIUM ION: 1.12 mmol/L — AB (ref 1.15–1.40)
CREATININE: 2.3 mg/dL — AB (ref 0.61–1.24)
Chloride: 99 mmol/L — ABNORMAL LOW (ref 101–111)
Glucose, Bld: 41 mg/dL — CL (ref 65–99)
HCT: 33 % — ABNORMAL LOW (ref 39.0–52.0)
Hemoglobin: 11.2 g/dL — ABNORMAL LOW (ref 13.0–17.0)
Potassium: 3.7 mmol/L (ref 3.5–5.1)
Sodium: 136 mmol/L (ref 135–145)
TCO2: 24 mmol/L (ref 0–100)

## 2016-07-04 LAB — CBG MONITORING, ED
GLUCOSE-CAPILLARY: 41 mg/dL — AB (ref 65–99)
GLUCOSE-CAPILLARY: 112 mg/dL — AB (ref 65–99)

## 2016-07-04 LAB — GLUCOSE, CAPILLARY: GLUCOSE-CAPILLARY: 94 mg/dL (ref 65–99)

## 2016-07-04 LAB — SAMPLE TO BLOOD BANK

## 2016-07-04 MED ORDER — NYSTATIN 100000 UNIT/ML MT SUSP
5.0000 mL | Freq: Four times a day (QID) | OROMUCOSAL | Status: DC
  Administered 2016-07-04 – 2016-07-06 (×7): 500000 [IU] via ORAL
  Filled 2016-07-04 (×8): qty 5

## 2016-07-04 MED ORDER — POLYETHYLENE GLYCOL 3350 17 G PO PACK: 17.0000 g | PACK | Freq: Every day | ORAL | Status: DC | PRN

## 2016-07-04 MED ORDER — LEVOTHYROXINE SODIUM 50 MCG PO TABS
25.0000 ug | ORAL_TABLET | Freq: Every day | ORAL | Status: DC
  Administered 2016-07-05: 25 ug via ORAL
  Filled 2016-07-04 (×2): qty 1

## 2016-07-04 MED ORDER — FENTANYL 50 MCG/HR TD PT72: 50.0000 ug | MEDICATED_PATCH | TRANSDERMAL | Status: DC

## 2016-07-04 MED ORDER — HYDROCODONE-ACETAMINOPHEN 5-325 MG PO TABS
1.0000 | ORAL_TABLET | ORAL | Status: DC | PRN
  Administered 2016-07-04: 2 via ORAL
  Administered 2016-07-05: 1 via ORAL
  Administered 2016-07-05 – 2016-07-06 (×4): 2 via ORAL
  Filled 2016-07-04 (×8): qty 2

## 2016-07-04 MED ORDER — LORAZEPAM 0.5 MG PO TABS
0.5000 mg | ORAL_TABLET | ORAL | Status: DC | PRN
  Administered 2016-07-04 – 2016-07-05 (×2): 0.5 mg via ORAL
  Filled 2016-07-04 (×2): qty 1

## 2016-07-04 MED ORDER — SODIUM CHLORIDE 0.9 % IV SOLN
INTRAVENOUS | Status: DC
  Administered 2016-07-04: 10:00:00 via INTRAVENOUS

## 2016-07-04 MED ORDER — HYDROMORPHONE HCL 1 MG/ML IJ SOLN
1.0000 mg | INTRAMUSCULAR | Status: DC | PRN
  Administered 2016-07-05 – 2016-07-06 (×9): 1 mg via INTRAVENOUS
  Filled 2016-07-04 (×9): qty 1

## 2016-07-04 MED ORDER — DEXTROSE 50 % IV SOLN
INTRAVENOUS | Status: AC
  Filled 2016-07-04: qty 50

## 2016-07-04 MED ORDER — ATORVASTATIN CALCIUM 10 MG PO TABS
10.0000 mg | ORAL_TABLET | Freq: Every day | ORAL | Status: DC
  Administered 2016-07-05: 10 mg via ORAL
  Filled 2016-07-04: qty 1

## 2016-07-04 MED ORDER — SODIUM CHLORIDE 0.9 % IV BOLUS (SEPSIS)
500.0000 mL | Freq: Once | INTRAVENOUS | Status: AC
  Administered 2016-07-04: 500 mL via INTRAVENOUS

## 2016-07-04 MED ORDER — SODIUM CHLORIDE 0.9% FLUSH
3.0000 mL | Freq: Two times a day (BID) | INTRAVENOUS | Status: DC
  Administered 2016-07-05 – 2016-07-06 (×3): 3 mL via INTRAVENOUS

## 2016-07-04 MED ORDER — ACETAMINOPHEN 650 MG RE SUPP: 650.0000 mg | Freq: Four times a day (QID) | RECTAL | Status: DC | PRN

## 2016-07-04 MED ORDER — ACETAMINOPHEN 325 MG PO TABS: 650.0000 mg | ORAL_TABLET | Freq: Four times a day (QID) | ORAL | Status: DC | PRN

## 2016-07-04 MED ORDER — ALBUTEROL SULFATE (2.5 MG/3ML) 0.083% IN NEBU: 3.0000 mL | INHALATION_SOLUTION | Freq: Four times a day (QID) | RESPIRATORY_TRACT | Status: DC | PRN

## 2016-07-04 MED ORDER — SODIUM CHLORIDE 0.9 % IV SOLN
INTRAVENOUS | Status: DC
  Administered 2016-07-04: 19:00:00 via INTRAVENOUS

## 2016-07-04 MED ORDER — BISACODYL 5 MG PO TBEC: 5.0000 mg | DELAYED_RELEASE_TABLET | Freq: Every day | ORAL | Status: DC | PRN

## 2016-07-04 MED ORDER — GEMFIBROZIL 600 MG PO TABS
600.0000 mg | ORAL_TABLET | Freq: Every day | ORAL | Status: DC
  Administered 2016-07-05 – 2016-07-06 (×2): 600 mg via ORAL
  Filled 2016-07-04 (×2): qty 1

## 2016-07-04 MED ORDER — SODIUM CHLORIDE 0.9 % IV SOLN
INTRAVENOUS | Status: AC
  Administered 2016-07-04: 21:00:00 via INTRAVENOUS

## 2016-07-04 MED ORDER — ONDANSETRON HCL 4 MG/2ML IJ SOLN: 4.0000 mg | Freq: Four times a day (QID) | INTRAMUSCULAR | Status: DC | PRN

## 2016-07-04 MED ORDER — ONDANSETRON HCL 4 MG/2ML IJ SOLN
4.0000 mg | Freq: Once | INTRAMUSCULAR | Status: AC
  Administered 2016-07-04: 4 mg via INTRAVENOUS
  Filled 2016-07-04: qty 2

## 2016-07-04 MED ORDER — DEXAMETHASONE 4 MG PO TABS
4.0000 mg | ORAL_TABLET | Freq: Two times a day (BID) | ORAL | Status: DC
  Administered 2016-07-05 – 2016-07-06 (×3): 4 mg via ORAL
  Filled 2016-07-04 (×7): qty 1

## 2016-07-04 MED ORDER — APIXABAN 5 MG PO TABS
5.0000 mg | ORAL_TABLET | Freq: Every day | ORAL | Status: DC
Start: 2016-07-04 — End: 2016-07-06
  Administered 2016-07-04 – 2016-07-05 (×2): 5 mg via ORAL
  Filled 2016-07-04 (×2): qty 1

## 2016-07-04 MED ORDER — HALOPERIDOL 0.5 MG PO TABS
0.5000 mg | ORAL_TABLET | Freq: Three times a day (TID) | ORAL | Status: DC | PRN
  Filled 2016-07-04: qty 1

## 2016-07-04 MED ORDER — ONDANSETRON HCL 4 MG PO TABS: 4.0000 mg | ORAL_TABLET | Freq: Four times a day (QID) | ORAL | Status: DC | PRN

## 2016-07-04 MED ORDER — INSULIN ASPART 100 UNIT/ML ~~LOC~~ SOLN
0.0000 [IU] | Freq: Three times a day (TID) | SUBCUTANEOUS | Status: DC
  Administered 2016-07-05: 3 [IU] via SUBCUTANEOUS
  Administered 2016-07-05: 2 [IU] via SUBCUTANEOUS
  Administered 2016-07-06: 3 [IU] via SUBCUTANEOUS
  Administered 2016-07-06: 2 [IU] via SUBCUTANEOUS

## 2016-07-04 MED ORDER — DEXTROSE 50 % IV SOLN
1.0000 | Freq: Once | INTRAVENOUS | Status: AC
  Administered 2016-07-04: 50 mL via INTRAVENOUS

## 2016-07-04 NOTE — ED Notes (Signed)
hospitalist in to speak with family and assess patient

## 2016-07-04 NOTE — ED Notes (Signed)
Family at bedside- Pt states that he is feeling better.

## 2016-07-04 NOTE — ED Notes (Signed)
Report to [place], Therapist, sports

## 2016-07-04 NOTE — Progress Notes (Signed)
After IV hydration patient's blood pressure remains low. Family understands patient is not a candidate for direct admit and patient will be transferred to ED. Report given to [facility]. Patient stable on transfer to ED via wheelchair.

## 2016-07-04 NOTE — Patient Instructions (Signed)
[place] at [facility] Discharge Instructions  RECOMMENDATIONS MADE BY THE CONSULTANT AND ANY TEST RESULTS WILL BE SENT TO YOUR REFERRING PHYSICIAN.  To ED for evaluation/admission to hospital.  Thank you for choosing [place] at [facility] to provide your oncology and hematology care.  To afford each patient quality time with our provider, please arrive at least 15 minutes before your scheduled appointment time.   Beginning January 23rd 2017 lab work for the [facility] will be done in the  Main lab at [facility] on 1st floor. If you have a lab appointment with the [place] please come in thru the  Main Entrance and check in at the main information desk  You need to re-schedule your appointment should you arrive 10 or more minutes late.  We strive to give you quality time with our providers, and arriving late affects you and other patients whose appointments are after yours.  Also, if you no show three or more times for appointments you may be dismissed from the clinic at the providers discretion.     Again, thank you for choosing [facility].  Our hope is that these requests will decrease the amount of time that you wait before being seen by our physicians.       _____________________________________________________________  Should you have questions after your visit to  [facility], please contact our office at [phone] between the hours of 8:30 a.m. and 4:30 p.m.  Voicemails left after 4:30 p.m. will not be returned until the following business day.  For prescription refill requests, have your pharmacy contact our office.         Resources For Cancer Patients and their Caregivers ? American Cancer Society: Can assist with transportation, wigs, general needs, runs Look Good Feel Better.        216-777-4305 ? Cancer Care: Provides financial assistance, online support groups, medication/co-pay  assistance.  1-800-813-HOPE 367-709-5916) ? [place] Assists [place] cancer patients and their families through emotional , educational and financial support.  507-057-1945 ? [place] DSS Where to apply for food stamps, Medicaid and utility assistance. 661-793-3616 ? [employer]: Transportation to medical appointments. 757-859-6231 ? Social Security Administration: May apply for disability if have a Stage IV cancer. 820-358-8710 (601)579-2534 ? [employer], Disability and Transit Services: Assists with nutrition, care and transit needs. [place] Support Programs: @10RELATIVEDAYS @ > Cancer Support Group  2nd Tuesday of the month 1pm-2pm, Journey Room  > Creative Journey  3rd Tuesday of the month 1130am-1pm, Journey Room  > Look Good Feel Better  1st Wednesday of the month 10am-12 noon, Journey Room (Call [place] to register [phone])

## 2016-07-04 NOTE — ED Triage Notes (Signed)
Pt sent from cancer center due to hypotension.

## 2016-07-04 NOTE — ED Notes (Signed)
Call to floor , they will call back asap

## 2016-07-04 NOTE — ED Notes (Signed)
Pt sent from [facility]- family a bedside- offered crackers

## 2016-07-04 NOTE — Consult Note (Signed)
Consultation Note Date: 07/04/2016   Patient Name: Jake Gonzalez  DOB: [birth year]  MRN: [id number]  Age / Sex: 60 y.o., male  PCP: Jake Chroman, MD Referring Physician: Fredia Sorrow, MD  Reason for Consultation: Establishing goals of care, Hospice Evaluation and Psychosocial/spiritual support  HPI/Patient Profile: 60 y.o. 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   Clinical Assessment and Goals of Care: Jake Gonzalez is lying quietly on the stretcher in his room. He greets me making and keeping eye contact. Present today is wife Jake Gonzalez, sons Jake Gonzalez and Jake Gonzalez, and his brother Jake Gonzalez.  I share the meaning of palliative medicine. We talk about Jake Gonzalez's health history, his functional status, and his symptom management. Jake Gonzalez shares his struggle with the side effects of his chemotherapy treatment.  He is tearful at times.  He shares that he would not want to take anymore chemotherapy due to side effect burden. I share that we support him in whatever his choice would be, but agree that chemotherapy has been difficult for him.  We talk about healthcare power of attorney (see below). We also talk about advanced directives. Jake Gonzalez is tearful and asks "can we talk about this later". I share that we can, and that he is expected to be admitted, I will see him tomorrow 10/10. I share that we will continue to support him through this difficult time.  Healthcare power of attorney NEXT OF KIN - Jake Gonzalez states that he wants his wife, and [place], to make health care decisions for him if he is unable.  Sons Rich and Temple, and brother, Jake Gonzalez, hear his choice.   SUMMARY OF RECOMMENDATIONS   no further chemotherapy treatments.  Considering choices related to advanced directives. Unable to speak about the benefits of in-home hospice today.   Code Status/Advance Care Planning:  Full code, at this time. We discussed the concepts of allow a natural death. We discussed the realities of CPR including chest compression breaking ribs. I share my worry that this will be painful for him, and would not change his cancer.   Symptom Management:   per Dr. Whitney Gonzalez, hospitalist.  Palliative Prophylaxis:   Frequent Pain Assessment  Additional Recommendations (Limitations, Scope, Preferences):  Jake Gonzalez shares that he does not want to have any more chemotherapy. He shares the burden that chemotherapy has caused.  Psycho-social/Spiritual:   Desire for further Chaplaincy support:no  Additional Recommendations: Caregiving  Support/Resources and Education on Hospice  Prognosis:   < 3 months, likely based on Jake Gonzalez's desire to forgo chemotherapy, focusing on comfort and dignity.  Discharge Planning: To be determined. I am unable to discuss the concepts and benefits of hospice in his home today.      Primary Diagnoses: Present on Admission: **None**   I have reviewed the medical record, interviewed the patient and family, and examined the  patient. The following aspects are pertinent.  Past Medical History:  Diagnosis Date  . CAD (coronary artery disease)   . Clotting disorder ([place])   . COPD (chronic obstructive pulmonary disease) ([place])   . Diabetes mellitus without complication ([place])   . High cholesterol   . Hypertension   . Myocardial infarction   . Pancreatic cancer [facility])    Social History   Social History  . Marital status: Married    Spouse name: N/A  . Number of children: N/A  . Years of education: N/A   Social History Main Topics  . Smoking status: Former Smoker    Packs/day: 2.00    Years: 35.00    Quit date: 11/21/2004  . Smokeless tobacco: Never Used  . Alcohol use No  . Drug use: No    . Sexual activity: Not Asked     Comment: married- 2 sons   Other Topics Concern  . None   Social History Narrative  . None   Family History  Problem Relation Age of Onset  . Heart disease Brother     prior to age 10  . Stroke Brother   . Hypertension Brother   . Cancer Mother     ovarian  . Alcoholism Father   . Cancer Maternal Uncle     stomach  . Other Brother     MVA   Scheduled Meds: Continuous Infusions: PRN Meds:. Medications Prior to Admission:  Prior to Admission medications   Medication Sig Start Date End Date Taking? Authorizing Provider  albuterol (PROVENTIL HFA;VENTOLIN HFA) 108 (90 Base) MCG/ACT inhaler Inhale 2 puffs into the lungs every 6 (six) hours as needed for wheezing or shortness of breath.    Historical Provider, MD  apixaban (ELIQUIS) 5 MG TABS tablet Take 5 mg by mouth at bedtime.    Historical Provider, MD  atenolol (TENORMIN) 100 MG tablet Take 1 tablet by mouth daily. 12/25/15   Historical Provider, MD  atorvastatin (LIPITOR) 10 MG tablet 10 mg daily.  11/18/15   Historical Provider, MD  dexamethasone (DECADRON) 4 MG tablet Take 1 tablet (4 mg total) by mouth 2 (two) times daily with a meal. 07/01/16   Jake Ranks, MD  fentaNYL (DURAGESIC - DOSED MCG/HR) 50 MCG/HR Place 1 patch (50 mcg total) onto the skin every 3 (three) days. 07/01/16   Jake Ranks, MD  gemfibrozil (LOPID) 600 MG tablet Take 1 tablet by mouth daily. 11/05/15   Historical Provider, MD  glimepiride (AMARYL) 4 MG tablet Take 1 tablet by mouth 2 (two) times daily. 11/16/15   Historical Provider, MD  haloperidol (HALDOL) 0.5 MG tablet Take 1 tablet (0.5 mg total) by mouth every 8 (eight) hours as needed (nausea). 07/01/16   Jake Ranks, MD  levofloxacin (LEVAQUIN) 750 MG tablet Take 1 tablet (750 mg total) by mouth daily. 06/30/16   Jake Lose, MD  lisinopril (PRINIVIL,ZESTRIL) 20 MG tablet Take 1 tablet by mouth daily. 10/29/15   Historical Provider, MD  LORazepam  (ATIVAN) 0.5 MG tablet Take 1 tablet (0.5 mg total) by mouth every 4 (four) hours as needed for anxiety (and nausea). 06/07/16   Jake Ranks, MD  metFORMIN (GLUCOPHAGE) 500 MG tablet Take 1 tablet by mouth. 2 tablets every morning and 1 tablet every evening 12/07/15   Historical Provider, MD  morphine (MS CONTIN) 15 MG 12 hr tablet Take one 15 mg tab and one 30 mg tab to equal 45 mg every 12 hours. 06/07/16  Jake Ranks, MD  morphine (MS CONTIN) 30 MG 12 hr tablet Take one 15 mg tab and one 30 mg tab to equal 45 mg every 12 hours 06/07/16   Jake Ranks, MD  nystatin (MYCOSTATIN/NYSTOP) powder 1 application daily. 06/15/16   Historical Provider, MD  ondansetron (ZOFRAN) 8 MG tablet Take 1 tablet (8 mg total) by mouth 2 (two) times daily as needed (Nausea or vomiting). 06/22/16   Jake Ranks, MD  ondansetron (ZOFRAN) 8 MG tablet Take 1 tablet (8 mg total) by mouth every 8 (eight) hours. 07/01/16   Jake Ranks, MD  pioglitazone (ACTOS) 15 MG tablet Take 1 tablet by mouth daily. 12/02/15   Historical Provider, MD  prochlorperazine (COMPAZINE) 10 MG tablet TAKE (1) TABLET EVERY SIX HOURS AS NEEDED FOR NAUSEA AND VOMITING. 06/24/16   Jake Ranks, MD  Red Yeast Rice 600 MG CAPS Take 1 capsule by mouth daily.    Historical Provider, MD   Allergies  Allergen Reactions  . Other     Steroids cause him to be very emotional and hyper.   Review of Systems  Unable to perform ROS: Other    Physical Exam  Constitutional: He is oriented to person, place, and time. No distress.  HENT:  Head: Normocephalic and atraumatic.  Cardiovascular: Normal rate and regular rhythm.   Pulmonary/Chest: Effort normal. No respiratory distress.  Abdominal: Soft. He exhibits no distension. There is no guarding.  Rounded obese abdomen  Neurological: He is alert and oriented to person, place, and time.  Skin: Skin is warm and dry.  Nursing note and vitals reviewed.   Vital Signs: BP (!) 86/56 (BP  Location: Left Arm)   Pulse 82   Temp 98.4 F (36.9 C) (Oral)   Resp 18   Ht 5\' 8"  (1.727 m)   Wt 92.5 kg (204 lb)   SpO2 95%   BMI 31.02 kg/m          SpO2: SpO2: 95 % O2 Device:SpO2: 95 % O2 Flow Rate: .   IO: Intake/output summary: No intake or output data in the 24 hours ending 07/04/16 1648  LBM:   Baseline Weight: Weight: 92.5 kg (204 lb) Most recent weight: Weight: 92.5 kg (204 lb)     Palliative Assessment/Data:   Flowsheet Rows   Flowsheet Row Most Recent Value  Intake Tab  Referral Department  Oncology  Unit at Time of Referral  Oncology Unit  Palliative Care Primary Diagnosis  Cancer  Date Notified  07/04/16  Palliative Care Type  New Palliative care  Reason for referral  End of Life Care Assistance, Clarify Goals of Care, Advance Care Planning  Date of Admission  07/04/16  Date first seen by Palliative Care  07/04/16  # of days Palliative referral response time  0 Day(s)  # of days IP prior to Palliative referral  0  Clinical Assessment  Palliative Performance Scale Score  50%  Pain Max last 24 hours  Not able to report  Pain Min Last 24 hours  Not able to report  Dyspnea Max Last 24 Hours  Not able to report  Dyspnea Min Last 24 hours  Not able to report  Psychosocial & Spiritual Assessment  Palliative Care Outcomes  Patient/Family meeting held?  Yes  Who was at the meeting?  wife, Jake Gonzalez, sons Jake Gonzalez and Jake Gonzalez, brother [place]   Palliative Care follow-up planned  -- [follow up wile at [facility]]      Time In: 1500  Time Out: 1535 Time Total: 35 minutes Greater than 50%  of this time was spent counseling and coordinating care related to the above assessment and plan.  Signed by: Drue Novel, NP   Please contact Palliative Medicine Team phone at [phone] for questions and concerns.  For individual provider: See Shea Evans

## 2016-07-04 NOTE — H&P (Signed)
History and Physical    Jake Gonzalez [id number] DOB: November 15, 1955 DOA: 07/04/2016  PCP: Jake Chroman, MD   Patient coming from: Home, by way of [place]  Chief Complaint: Hypotension   HPI: Jake Gonzalez is a 60 y.o. male with medical history significant for type 2 diabetes mellitus, hypertension, left lower extremity DVT, and recent diagnosis of pancreatic cancer metastatic to the liver who presents from the cancer center for evaluation of hypotension. Patient was diagnosed with pancreatic adenocarcinoma in September of this year and was initiated on palliative chemotherapy on 06/23/2016. He was scheduled to receive a second treatment with chemotherapy on 07/01/2016, but treatment was held at that time due to dehydration with hypotension and acute kidney injury. Blood pressure was 66/40. Patient was given IV fluids during that visit and followed up back at the [facility] today. Patient was again hypotensive, and while his creatinine had improved slightly, BUN was elevated further to 42. Patient was given IV fluid hydration, but remained hypotensive and was directed to the emergency department for evaluation. Patient denies fevers, chills, cough, or dyspnea. There is been no dysuria or flank pain. Patient endorses nausea with occasional vomiting, but denies abdominal pain or diarrhea. He denies any rash or wound. There is no lightheadedness, change in vision or hearing, or focal numbness or weakness.  ED Course: Upon arrival to the ED, patient is found to be afebrile, saturating adequately on room air, hypotensive to 86/56, and with vitals otherwise stable. EKG demonstrates sinus rhythm with T-wave flattening in the anterolateral leads. Chest x-ray is notable for mild diffuse pulmonary vascular congestion without overt edema. Chemistry panel is notable for a BUN of 42 and serum creatinine of 2.38, up from 0.6 earlier this month. CBC is notable for a leukopenia with WBC of 3100 and absolute  neutrophil count of 300. Lactic acid is reassuring at 1.43. Patient was given two 500 mL normal saline boluses and continued on normal saline infusion. Blood pressure has continued to trend up with fluid, but the patient became hypoglycemic with a blood sugar of 41. He was given 1 ampule of 50% dextrose and some crackers. Sugars have since remained stable. Blood and urine cultures were obtained and are incubating. Patient will be admitted to the telemetry unit for ongoing evaluation and management of hypotension with acute kidney injury and neutropenia.   Review of Systems:  All other systems reviewed and apart from HPI, are negative.  Past Medical History:  Diagnosis Date  . CAD (coronary artery disease)   . Clotting disorder ([place])   . COPD (chronic obstructive pulmonary disease) ([place])   . Diabetes mellitus without complication ([place])   . High cholesterol   . Hypertension   . Myocardial infarction   . Pancreatic cancer [facility])     Past Surgical History:  Procedure Laterality Date  . BACK SURGERY    . CARDIAC SURGERY     cardiac stents  . hemilaminectomy  2003  . HERNIA REPAIR    . LUMBAR FUSION  1997   L4-5 X2  . TONSILLECTOMY       reports that he quit smoking about 11 years ago. He has a 70.00 pack-year smoking history. He has never used smokeless tobacco. He reports that he does not drink alcohol or use drugs.  Allergies  Allergen Reactions  . Other     Steroids cause him to be very emotional and hyper.    Family History  Problem Relation Age of Onset  .  Heart disease Brother     prior to age 66  . Stroke Brother   . Hypertension Brother   . Cancer Mother     ovarian  . Alcoholism Father   . Cancer Maternal Uncle     stomach  . Other Brother     MVA     Prior to Admission medications   Medication Sig Start Date End Date Taking? Authorizing Provider  albuterol (PROVENTIL HFA;VENTOLIN HFA) 108 (90 Base) MCG/ACT inhaler Inhale 2 puffs into the lungs every 6 (six)  hours as needed for wheezing or shortness of breath.   Yes Historical Provider, MD  apixaban (ELIQUIS) 5 MG TABS tablet Take 5 mg by mouth at bedtime.   Yes Historical Provider, MD  atenolol (TENORMIN) 100 MG tablet Take 1 tablet by mouth daily. 12/25/15  Yes Historical Provider, MD  atorvastatin (LIPITOR) 10 MG tablet Take 10 mg by mouth every morning.  11/18/15  Yes Historical Provider, MD  cilostazol (PLETAL) 100 MG tablet Take 100 mg by mouth 2 (two) times daily.   Yes Historical Provider, MD  dexamethasone (DECADRON) 4 MG tablet Take 1 tablet (4 mg total) by mouth 2 (two) times daily with a meal. 07/01/16  Yes Patrici Ranks, MD  fentaNYL (DURAGESIC - DOSED MCG/HR) 50 MCG/HR Place 1 patch (50 mcg total) onto the skin every 3 (three) days. 07/01/16  Yes Patrici Ranks, MD  gemfibrozil (LOPID) 600 MG tablet Take 1 tablet by mouth daily. 11/05/15  Yes Historical Provider, MD  glimepiride (AMARYL) 4 MG tablet Take 1 tablet by mouth 2 (two) times daily. 11/16/15  Yes Historical Provider, MD  haloperidol (HALDOL) 0.5 MG tablet Take 1 tablet (0.5 mg total) by mouth every 8 (eight) hours as needed (nausea). 07/01/16  Yes Patrici Ranks, MD  levothyroxine (SYNTHROID, LEVOTHROID) 25 MCG tablet Take 25 mcg by mouth daily before breakfast.   Yes Historical Provider, MD  lisinopril (PRINIVIL,ZESTRIL) 20 MG tablet Take 1 tablet by mouth daily. 10/29/15  Yes Historical Provider, MD  LORazepam (ATIVAN) 0.5 MG tablet Take 1 tablet (0.5 mg total) by mouth every 4 (four) hours as needed for anxiety (and nausea). 06/07/16  Yes Patrici Ranks, MD  metFORMIN (GLUCOPHAGE) 500 MG tablet Take 1-2 tablets by mouth 2 (two) times daily with a meal. 2 tablets every morning and 1 tablet every evening 12/07/15  Yes Historical Provider, MD  morphine (MS CONTIN) 15 MG 12 hr tablet Take one 15 mg tab and one 30 mg tab to equal 45 mg every 12 hours. 06/07/16  Yes Patrici Ranks, MD  morphine (MS CONTIN) 30 MG 12 hr tablet Take  one 15 mg tab and one 30 mg tab to equal 45 mg every 12 hours 06/07/16  Yes Patrici Ranks, MD  nystatin (MYCOSTATIN/NYSTOP) powder Apply 1 application topically daily.  06/15/16  Yes Historical Provider, MD  ondansetron (ZOFRAN) 8 MG tablet Take 1 tablet (8 mg total) by mouth every 8 (eight) hours. 07/01/16  Yes Patrici Ranks, MD  pioglitazone (ACTOS) 15 MG tablet Take 1 tablet by mouth daily. 12/02/15  Yes Historical Provider, MD  prochlorperazine (COMPAZINE) 10 MG tablet TAKE (1) TABLET EVERY SIX HOURS AS NEEDED FOR NAUSEA AND VOMITING. 06/24/16  Yes Patrici Ranks, MD  Red Yeast Rice 600 MG CAPS Take 1 capsule by mouth daily.   Yes Historical Provider, MD  levofloxacin (LEVAQUIN) 750 MG tablet Take 1 tablet (750 mg total) by mouth daily. Patient not  taking: Reported on 07/04/2016 06/30/16   Thurnell Lose, MD  ondansetron (ZOFRAN) 8 MG tablet Take 1 tablet (8 mg total) by mouth 2 (two) times daily as needed (Nausea or vomiting). Patient not taking: Reported on 07/04/2016 06/22/16   Patrici Ranks, MD    Physical Exam: Vitals:   07/04/16 1928 07/04/16 1930 07/04/16 2000 07/04/16 2030  BP: 100/72 113/66 95/57 104/58  Pulse: 78 89  97  Resp: 18 19 19 16   Temp:      TempSrc:      SpO2: 98% 96%  96%  Weight:      Height:          Constitutional: NAD, calm, chronically ill appearing Eyes: PERTLA, lids and conjunctivae normal ENMT: Mucous membranes are dry. White plaques on tongue and soft palate. Posterior pharynx clear of any exudate or lesions.   Neck: normal, supple, no masses, no thyromegaly Respiratory: clear to auscultation bilaterally, no wheezing, no crackles. Normal respiratory effort.    Cardiovascular: S1 & S2 heard, regular rate and rhythm. No carotid bruits. No significant JVD. Abdomen: Mild distension, no tenderness, no masses palpated. Bowel sounds normal.  Musculoskeletal: no clubbing / cyanosis. No joint deformity upper and lower extremities. Normal muscle tone.    Skin: no significant rashes, lesions, ulcers. Warm, dry, well-perfused. Neurologic: CN 2-12 grossly intact. Sensation intact, DTR normal. Strength 5/5 in all 4 limbs.  Psychiatric: Normal judgment and insight. Alert and oriented x 3. Normal mood and affect.     Labs on Admission: I have personally reviewed following labs and imaging studies  CBC:  Recent Labs Lab 06/28/16 0431 06/29/16 0617 07/01/16 0843 07/04/16 0848 07/04/16 1809  WBC 11.4* 4.4 2.0* 3.1*  --   NEUTROABS 10.3* 3.4 1.0* 0.3*  --   HGB 9.6* 9.7* 10.5* 11.3* 11.2*  HCT 30.2* 30.3* 33.1* 35.0* 33.0*  MCV 86.0 85.8 86.2 85.8  --   PLT 118* 88* 78* 161  --    Basic Metabolic Panel:  Recent Labs Lab 06/28/16 0431 06/29/16 0617 07/01/16 0843 07/04/16 0848 07/04/16 1809  NA 132* 134* 133* 135 136  K 4.2 3.6 4.3 4.1 3.7  CL 101 100* 96* 98* 99*  CO2 23 25 24 26   --   GLUCOSE 134* 136* 156* 118* 41*  BUN 17 10 29* 42* 37*  CREATININE 0.87 0.57* 2.67* 2.38* 2.30*  CALCIUM 7.2* 7.6* 8.7* 8.8*  --    GFR: Estimated Creatinine Clearance: 37.2 mL/min (by C-G formula based on SCr of 2.3 mg/dL (H)). Liver Function Tests:  Recent Labs Lab 06/28/16 0431 07/01/16 0843 07/04/16 0848  AST 47* 50* 48*  ALT 31 40 30  ALKPHOS 101 127* 160*  BILITOT 1.2 0.9 0.7  PROT 5.9* 6.9 6.4*  ALBUMIN 2.5* 3.0* 2.9*   No results for input(s): LIPASE, AMYLASE in the last 168 hours. No results for input(s): AMMONIA in the last 168 hours. Coagulation Profile: No results for input(s): INR, PROTIME in the last 168 hours. Cardiac Enzymes: No results for input(s): CKTOTAL, CKMB, CKMBINDEX, TROPONINI in the last 168 hours. BNP (last 3 results) No results for input(s): PROBNP in the last 8760 hours. HbA1C: No results for input(s): HGBA1C in the last 72 hours. CBG:  Recent Labs Lab 06/28/16 1142 07/04/16 1821 07/04/16 1927  GLUCAP 117* 41* 112*   Lipid Profile: No results for input(s): CHOL, HDL, LDLCALC, TRIG,  CHOLHDL, LDLDIRECT in the last 72 hours. Thyroid Function Tests: No results for input(s): TSH, T4TOTAL, FREET4,  T3FREE, THYROIDAB in the last 72 hours. Anemia Panel: No results for input(s): VITAMINB12, FOLATE, FERRITIN, TIBC, IRON, RETICCTPCT in the last 72 hours. Urine analysis:    Component Value Date/Time   COLORURINE YELLOW 07/04/2016 1915   APPEARANCEUR CLEAR 07/04/2016 1915   LABSPEC 1.020 07/04/2016 1915   PHURINE 5.5 07/04/2016 1915   GLUCOSEU 100 (A) 07/04/2016 1915   HGBUR NEGATIVE 07/04/2016 1915   BILIRUBINUR NEGATIVE 07/04/2016 1915   KETONESUR NEGATIVE 07/04/2016 1915   PROTEINUR NEGATIVE 07/04/2016 1915   NITRITE NEGATIVE 07/04/2016 1915   LEUKOCYTESUR NEGATIVE 07/04/2016 1915   Sepsis Labs: @LABRCNTIP (procalcitonin:4,lacticidven:4) ) Recent Results (from the past 240 hour(s))  Culture, blood (Routine X 2)     Status: None   Collection Time: 06/27/16  9:45 AM  Result Value Ref Range Status   Specimen Description BLOOD RIGHT ARM DRAWN BY RN R.M.  Final   Special Requests   Final    BOTTLES DRAWN AEROBIC AND ANAEROBIC AEB=8CC ANA=6CC   Culture NO GROWTH 5 DAYS  Final   Report Status 07/02/2016 FINAL  Final  Culture, blood (Routine X 2)     Status: None   Collection Time: 06/27/16  9:54 AM  Result Value Ref Range Status   Specimen Description BLOOD LEFT ANTECUBITAL  Final   Special Requests BOTTLES DRAWN AEROBIC AND ANAEROBIC 10CC EACH  Final   Culture NO GROWTH 5 DAYS  Final   Report Status 07/02/2016 FINAL  Final  Urine culture     Status: None   Collection Time: 06/27/16  2:30 PM  Result Value Ref Range Status   Specimen Description URINE, RANDOM  Final   Special Requests NONE  Final   Culture NO GROWTH Performed at [facility]   Final   Report Status 06/29/2016 FINAL  Final  MRSA PCR Screening     Status: None   Collection Time: 06/27/16  4:21 PM  Result Value Ref Range Status   MRSA by PCR NEGATIVE NEGATIVE Final    Comment:        The  GeneXpert MRSA Assay (FDA approved for NASAL specimens only), is one component of a comprehensive MRSA colonization surveillance program. It is not intended to diagnose MRSA infection nor to guide or monitor treatment for MRSA infections.      Radiological Exams on Admission: Dg Chest Port 1 View  Result Date: 07/04/2016 CLINICAL DATA:  Initial evaluation for acute hypotension. EXAM: PORTABLE CHEST 1 VIEW COMPARISON:  Prior radiograph from 06/27/2016. FINDINGS: Extent to a shin of the cardiac silhouette related AP technique. Mediastinal silhouette within normal limits. Aortic atherosclerosis noted. Lungs mildly hypoinflated. Mild pulmonary vascular congestion without overt pulmonary edema. No pleural effusion. No pneumothorax. No definite focal infiltrates. No acute osseous abnormality. IMPRESSION: Shallow lung inflation with mild diffuse pulmonary vascular congestion without overt pulmonary edema. Electronically Signed   By: Jeannine Boga M.D.   On: 07/04/2016 17:57    EKG: Independently reviewed. Sinus rhythm, anterolateral T-wave flattening  Assessment/Plan  1. Hypotension, hx of HTN  - Likely secondary to poor oral intake, vomiting, and dehydration  - Infectious etiology considered, but no fever/chills, apparent source, or lactate elevation - Cardiac etiology considered, but there is no angina or significant change in EKG - BP steadily rising with IVF, will continue cautiously as already vascular congestion on admission CXR  - HTN managed with lisinopril and atenolol at home; both of these held on admission and would consider discontinuing lisinopril d/t propensity for dehydration and AKI, as  well as low BP  2. Hypoglycemia, type II DM   - CBG 41 in ED; likely secondary to continued sulfonylurea use in setting of poor nutrition, wt loss, and AKI - Hypoglycemia resolved with D-50% and crackers, will monitor with CBG's  - Managed at home with glimepiride, pioglitazone, and  metformin; will hold these while in hospital and would consider discontinuing the sulfonylurea at time of discharge  - Check CBG with meals, qHS, and for any suspicion of hypoglycemia  - Use a low-intensity sliding-scale correctional prn  - Check A1c   3. AKI - SCr 2.38 on admission, up from 0.6 on 06/29/16  - Likely a prerenal azotemia in setting of hypotension  - Given IVF and [place] PTA, and 1 L NS in ED  - Continue IVF with NS overnight - Avoid nephrotoxins, hold lisinopril  - Repeat chem panel in am    4. Neutropenia  - WBC 3,100 on admission with ANC of 300  - There is no fever or chills, and no apparent source of infection  - Likely chemotherapy-induced  - Infection surveillance; blood cultures are incubating; start abx if febrile  - Protective precautions    5. Oral candidiasis   - Likely secondary to chemotherapy-induced immunosuppression  - Treat with oral nystatin   6. Pancreatic cancer, metastatic   - Diagnosed in Sept 2017 and started on chemotherapy with palliative intent  - Followed by oncologist, Dr. Whitney Muse - Ongoing management per oncology    7. DVT - LLE DVT, has been managed with Eliquis 5 mg qD after reportedly developing N/V at the usual dose - Continue current-dose Eliquis with close monitoring of renal function; anticipate renal function to improve now that he is fluid-resuscitated and BP is improved  - No evidence for acute PE    DVT prophylaxis: Eliquis Code Status: Full  Family Communication: Wife and two sons updated at bedside Disposition Plan: Admit to telemetry Consults called: None Admission status: Inpatient    Vianne Bulls, MD [facility] Pager [phone]  If 7PM-7AM, please contact night-coverage [email or web address] Password [facility]  07/04/2016, 8:47 PM

## 2016-07-04 NOTE — ED Notes (Signed)
Blood sugar taken- 112

## 2016-07-04 NOTE — ED Notes (Signed)
Pharm tech in to assess meds

## 2016-07-04 NOTE — ED Provider Notes (Signed)
[facility] DEPT Provider Note   CSN: [id number] Arrival date & time: 07/04/16  1601     History   Chief Complaint Chief Complaint  Patient presents with  . Hypotension    HPI PADRAIC SWEDLUND is a 60 y.o. male.  Patient referred from hematology oncology clinic. Patient with known metastatic pancreatic cancer to the liver. At first dose of chemotherapy about 10 days ago. Was postop second dose on Friday patient was evaluated at the clinic noted to be dehydrated based on labs was given fluids and and brought back today for reassessment of labs and hoping that they would be able to give his dose of chemotherapy. Patient's labs were actually worse today and patient was also hypotensive. Patient was admitted October 2 toe with blood pressures in the 80s at that time. Workup was not consistent with sepsis. Patient is on hypertensive meds. Patient does have a fentanyl patch and is on pain medicines at home. He hasn't had any of those of this sentinel patch. Patient states that his had difficulty with the abdominal pain. No fevers.      Past Medical History:  Diagnosis Date  . CAD (coronary artery disease)   . Clotting disorder ([place])   . COPD (chronic obstructive pulmonary disease) ([place])   . Diabetes mellitus without complication ([place])   . High cholesterol   . Hypertension   . Myocardial infarction   . Pancreatic cancer [facility])     Patient Active Problem List   Diagnosis Date Noted  . Hypotension 07/04/2016  . Palliative care encounter   . DNR (do not resuscitate) discussion   . Goals of care, counseling/discussion   . Malignant neoplasm of body of pancreas ([place])   . Chemotherapy induced nausea and vomiting   . Septic shock ([place]) 06/27/2016  . Sepsis ([place]) 06/27/2016  . CAD (coronary artery disease) 06/27/2016  . Hypertension 06/27/2016  . High cholesterol 06/27/2016  . Pancreatic cancer metastasized to liver ([place]) 06/21/2016  . Pancreatic mass 06/12/2016    Past  Surgical History:  Procedure Laterality Date  . BACK SURGERY    . CARDIAC SURGERY     cardiac stents  . hemilaminectomy  2003  . HERNIA REPAIR    . LUMBAR FUSION  1997   L4-5 X2  . TONSILLECTOMY         Home Medications    Prior to Admission medications   Medication Sig Start Date End Date Taking? Authorizing Provider  albuterol (PROVENTIL HFA;VENTOLIN HFA) 108 (90 Base) MCG/ACT inhaler Inhale 2 puffs into the lungs every 6 (six) hours as needed for wheezing or shortness of breath.    Historical Provider, MD  apixaban (ELIQUIS) 5 MG TABS tablet Take 5 mg by mouth at bedtime.    Historical Provider, MD  atenolol (TENORMIN) 100 MG tablet Take 1 tablet by mouth daily. 12/25/15   Historical Provider, MD  atorvastatin (LIPITOR) 10 MG tablet 10 mg daily.  11/18/15   Historical Provider, MD  dexamethasone (DECADRON) 4 MG tablet Take 1 tablet (4 mg total) by mouth 2 (two) times daily with a meal. 07/01/16   Patrici Ranks, MD  fentaNYL (DURAGESIC - DOSED MCG/HR) 50 MCG/HR Place 1 patch (50 mcg total) onto the skin every 3 (three) days. 07/01/16   Patrici Ranks, MD  gemfibrozil (LOPID) 600 MG tablet Take 1 tablet by mouth daily. 11/05/15   Historical Provider, MD  glimepiride (AMARYL) 4 MG tablet Take 1 tablet by mouth 2 (two) times daily. 11/16/15  Historical Provider, MD  haloperidol (HALDOL) 0.5 MG tablet Take 1 tablet (0.5 mg total) by mouth every 8 (eight) hours as needed (nausea). 07/01/16   Patrici Ranks, MD  levofloxacin (LEVAQUIN) 750 MG tablet Take 1 tablet (750 mg total) by mouth daily. 06/30/16   Thurnell Lose, MD  lisinopril (PRINIVIL,ZESTRIL) 20 MG tablet Take 1 tablet by mouth daily. 10/29/15   Historical Provider, MD  LORazepam (ATIVAN) 0.5 MG tablet Take 1 tablet (0.5 mg total) by mouth every 4 (four) hours as needed for anxiety (and nausea). 06/07/16   Patrici Ranks, MD  metFORMIN (GLUCOPHAGE) 500 MG tablet Take 1 tablet by mouth. 2 tablets every morning and 1 tablet  every evening 12/07/15   Historical Provider, MD  morphine (MS CONTIN) 15 MG 12 hr tablet Take one 15 mg tab and one 30 mg tab to equal 45 mg every 12 hours. 06/07/16   Patrici Ranks, MD  morphine (MS CONTIN) 30 MG 12 hr tablet Take one 15 mg tab and one 30 mg tab to equal 45 mg every 12 hours 06/07/16   Patrici Ranks, MD  nystatin (MYCOSTATIN/NYSTOP) powder 1 application daily. 06/15/16   Historical Provider, MD  ondansetron (ZOFRAN) 8 MG tablet Take 1 tablet (8 mg total) by mouth 2 (two) times daily as needed (Nausea or vomiting). 06/22/16   Patrici Ranks, MD  ondansetron (ZOFRAN) 8 MG tablet Take 1 tablet (8 mg total) by mouth every 8 (eight) hours. 07/01/16   Patrici Ranks, MD  pioglitazone (ACTOS) 15 MG tablet Take 1 tablet by mouth daily. 12/02/15   Historical Provider, MD  prochlorperazine (COMPAZINE) 10 MG tablet TAKE (1) TABLET EVERY SIX HOURS AS NEEDED FOR NAUSEA AND VOMITING. 06/24/16   Patrici Ranks, MD  Red Yeast Rice 600 MG CAPS Take 1 capsule by mouth daily.    Historical Provider, MD    Family History Family History  Problem Relation Age of Onset  . Heart disease Brother     prior to age 45  . Stroke Brother   . Hypertension Brother   . Cancer Mother     ovarian  . Alcoholism Father   . Cancer Maternal Uncle     stomach  . Other Brother     MVA    Social History Social History  Substance Use Topics  . Smoking status: Former Smoker    Packs/day: 2.00    Years: 35.00    Quit date: 11/21/2004  . Smokeless tobacco: Never Used  . Alcohol use No     Allergies   Other   Review of Systems Review of Systems  Constitutional: Positive for appetite change and fatigue. Negative for fever.  HENT: Negative for congestion.   Eyes: Negative for visual disturbance.  Respiratory: Negative for shortness of breath.   Cardiovascular: Negative for chest pain.  Gastrointestinal: Positive for abdominal pain, nausea and vomiting.  Genitourinary: Negative for  dysuria.  Musculoskeletal: Positive for back pain.  Skin: Negative for rash.  Neurological: Positive for dizziness and light-headedness. Negative for headaches.  Hematological: Bruises/bleeds easily.  Psychiatric/Behavioral: Negative for confusion.     Physical Exam Updated Vital Signs BP 113/66   Pulse 89   Temp 98.4 F (36.9 C) (Oral)   Resp 19   Ht 5\' 8"  (1.727 m)   Wt 92.5 kg   SpO2 96%   BMI 31.02 kg/m   Physical Exam  Constitutional: He is oriented to person, place, and time. He appears  well-developed and well-nourished. No distress.  HENT:  Head: Normocephalic and atraumatic.  Mucous membranes dry  Eyes: Conjunctivae and EOM are normal. Pupils are equal, round, and reactive to light.  Neck: Normal range of motion. Neck supple.  Cardiovascular: Normal rate, regular rhythm and normal heart sounds.   No murmur heard. Pulmonary/Chest: Effort normal and breath sounds normal. No respiratory distress.  Abdominal: Soft. Bowel sounds are normal. There is no tenderness.  Musculoskeletal: Normal range of motion.  Neurological: He is alert and oriented to person, place, and time. No cranial nerve deficit. He exhibits normal muscle tone. Coordination normal.  Nursing note and vitals reviewed.    ED Treatments / Results  Labs (all labs ordered are listed, but only abnormal results are displayed) Labs Reviewed  I-STAT CHEM 8, ED - Abnormal; Notable for the following:       Result Value   Chloride 99 (*)    BUN 37 (*)    Creatinine, Ser 2.30 (*)    Glucose, Bld 41 (*)    Calcium, Ion 1.12 (*)    Hemoglobin 11.2 (*)    HCT 33.0 (*)    All other components within normal limits  CBG MONITORING, ED - Abnormal; Notable for the following:    Glucose-Capillary 41 (*)    All other components within normal limits  CBG MONITORING, ED - Abnormal; Notable for the following:    Glucose-Capillary 112 (*)    All other components within normal limits  CULTURE, BLOOD (ROUTINE X 2)    CULTURE, BLOOD (ROUTINE X 2)  URINE CULTURE  URINALYSIS, ROUTINE W REFLEX MICROSCOPIC (NOT AT [facility])  I-STAT CG4 LACTIC ACID, ED    EKG  EKG Interpretation None       Radiology Dg Chest Port 1 View  Result Date: 07/04/2016 CLINICAL DATA:  Initial evaluation for acute hypotension. EXAM: PORTABLE CHEST 1 VIEW COMPARISON:  Prior radiograph from 06/27/2016. FINDINGS: Extent to a shin of the cardiac silhouette related AP technique. Mediastinal silhouette within normal limits. Aortic atherosclerosis noted. Lungs mildly hypoinflated. Mild pulmonary vascular congestion without overt pulmonary edema. No pleural effusion. No pneumothorax. No definite focal infiltrates. No acute osseous abnormality. IMPRESSION: Shallow lung inflation with mild diffuse pulmonary vascular congestion without overt pulmonary edema. Electronically Signed   By: Jeannine Boga M.D.   On: 07/04/2016 17:57    Procedures Procedures (including critical care time)   CRITICAL CARE Performed by: Fredia Sorrow Total critical care time: 30 minutes Critical care time was exclusive of separately billable procedures and treating other patients. Critical care was necessary to treat or prevent imminent or life-threatening deterioration. Critical care was time spent personally by me on the following activities: development of treatment plan with patient and/or surrogate as well as nursing, discussions with consultants, evaluation of patient's response to treatment, examination of patient, obtaining history from patient or surrogate, ordering and performing treatments and interventions, ordering and review of laboratory studies, ordering and review of radiographic studies, pulse oximetry and re-evaluation of patient's condition.  Medications Ordered in ED Medications  0.9 %  sodium chloride infusion ( Intravenous New Bag/Given 07/04/16 1851)  sodium chloride 0.9 % bolus 500 mL (500 mLs Intravenous New Bag/Given 07/04/16 1839)   ondansetron (ZOFRAN) injection 4 mg (4 mg Intravenous Given 07/04/16 1837)  dextrose 50 % solution 50 mL (50 mLs Intravenous Given 07/04/16 1836)  sodium chloride 0.9 % bolus 500 mL (500 mLs Intravenous New Bag/Given 07/04/16 1840)     Initial Impression / Assessment and Plan /  ED Course  I have reviewed the triage vital signs and the nursing notes.  Pertinent labs & imaging results that were available during my care of the patient were reviewed by me and considered in my medical decision making (see chart for details).  Clinical Course    Patient sent down from the hematology oncology clinic for hypotension. Blood pressure was in the 80s. Patient was alert. Workup shows no evidence of sepsis. Patient's labs from earlier this morning consistent with some dehydration. Patient responded to fluids. Blood pressures now around [id number] systolic patient feels better. Patient also developed hypoglycemia blood sugars went down to the 40s patient given an amp of D50. And then patient said sugar so far holding okay.  Patient noted to be on blood pressure medicine and obviously pain medicines due to the metastatic pancreatic cancer. May need to reconsider him being on blood pressure medicines. Patient will require admission for further correction of the dehydration. Hospitalist will admit to telemetry.  Final Clinical Impressions(s) / ED Diagnoses   Final diagnoses:  Hypotension, unspecified hypotension type  Dehydration    New Prescriptions New Prescriptions   No medications on file     Fredia Sorrow, MD 07/04/16 2040

## 2016-07-05 DIAGNOSIS — Z7189 Other specified counseling: Secondary | ICD-10-CM

## 2016-07-05 DIAGNOSIS — C259 Malignant neoplasm of pancreas, unspecified: Secondary | ICD-10-CM

## 2016-07-05 DIAGNOSIS — C787 Secondary malignant neoplasm of liver and intrahepatic bile duct: Secondary | ICD-10-CM

## 2016-07-05 LAB — CBC WITH DIFFERENTIAL/PLATELET
BASOS PCT: 2 %
Basophils Absolute: 0.1 10*3/uL (ref 0.0–0.1)
EOS PCT: 0 %
Eosinophils Absolute: 0 10*3/uL (ref 0.0–0.7)
HEMATOCRIT: 30.3 % — AB (ref 39.0–52.0)
Hemoglobin: 9.7 g/dL — ABNORMAL LOW (ref 13.0–17.0)
LYMPHS ABS: 1.6 10*3/uL (ref 0.7–4.0)
Lymphocytes Relative: 36 %
MCH: 27.4 pg (ref 26.0–34.0)
MCHC: 32 g/dL (ref 30.0–36.0)
MCV: 85.6 fL (ref 78.0–100.0)
MONOS PCT: 32 %
Monocytes Absolute: 1.4 10*3/uL — ABNORMAL HIGH (ref 0.1–1.0)
NEUTROS ABS: 1.4 10*3/uL — AB (ref 1.7–7.7)
Neutrophils Relative %: 30 %
Platelets: 135 10*3/uL — ABNORMAL LOW (ref 150–400)
RBC: 3.54 MIL/uL — AB (ref 4.22–5.81)
RDW: 14.4 % (ref 11.5–15.5)
WBC: 4.5 10*3/uL (ref 4.0–10.5)

## 2016-07-05 LAB — GLUCOSE, CAPILLARY
GLUCOSE-CAPILLARY: 45 mg/dL — AB (ref 65–99)
GLUCOSE-CAPILLARY: 101 mg/dL — AB (ref 65–99)
Glucose-Capillary: 151 mg/dL — ABNORMAL HIGH (ref 65–99)
Glucose-Capillary: 219 mg/dL — ABNORMAL HIGH (ref 65–99)
Glucose-Capillary: 235 mg/dL — ABNORMAL HIGH (ref 65–99)

## 2016-07-05 LAB — COMPREHENSIVE METABOLIC PANEL
ALT: 20 U/L (ref 17–63)
AST: 32 U/L (ref 15–41)
Albumin: 2.3 g/dL — ABNORMAL LOW (ref 3.5–5.0)
Alkaline Phosphatase: 127 U/L — ABNORMAL HIGH (ref 38–126)
Anion gap: 7 (ref 5–15)
BILIRUBIN TOTAL: 0.7 mg/dL (ref 0.3–1.2)
BUN: 29 mg/dL — AB (ref 6–20)
CO2: 25 mmol/L (ref 22–32)
Calcium: 7.6 mg/dL — ABNORMAL LOW (ref 8.9–10.3)
Chloride: 103 mmol/L (ref 101–111)
Creatinine, Ser: 1.3 mg/dL — ABNORMAL HIGH (ref 0.61–1.24)
GFR, EST NON AFRICAN AMERICAN: 58 mL/min — AB (ref >60)
Glucose, Bld: 46 mg/dL — ABNORMAL LOW (ref 65–99)
POTASSIUM: 3.4 mmol/L — AB (ref 3.5–5.1)
Sodium: 135 mmol/L (ref 135–145)
TOTAL PROTEIN: 5.4 g/dL — AB (ref 6.5–8.1)

## 2016-07-05 MED ORDER — POTASSIUM CHLORIDE CRYS ER 20 MEQ PO TBCR
40.0000 meq | EXTENDED_RELEASE_TABLET | Freq: Once | ORAL | Status: AC
  Administered 2016-07-05: 40 meq via ORAL
  Filled 2016-07-05: qty 2

## 2016-07-05 NOTE — Evaluation (Signed)
Physical Therapy Evaluation Patient Details Name: Jake Gonzalez MRN: [id number] DOB: 1956-07-18 Today's Date: 07/05/2016   History of Present Illness  HPI: Jake Gonzalez is a 60 y.o. male with medical history significant for type 2 diabetes mellitus, hypertension, left lower extremity DVT, and recent diagnosis of pancreatic cancer metastatic to the liver who presents from the cancer center for evaluation of hypotension. Patient was diagnosed with pancreatic adenocarcinoma in September of this year and was initiated on palliative chemotherapy on 06/23/2016. He was scheduled to receive a second treatment with chemotherapy on 07/01/2016, but treatment was held at that time due to dehydration with hypotension and acute kidney injury. Blood pressure was 66/40. Patient was given IV fluids during that visit and followed up back at the [facility] [place] [facility] today. Patient was again hypotensive, and while his creatinine had improved slightly, BUN was elevated further to 42. Patient was given IV fluid hydration, but remained hypotensive and was directed to the emergency department for evaluation. Patient denies fevers, chills, cough, or dyspnea. There is been no dysuria or flank pain. Patient endorses nausea with occasional vomiting, but denies abdominal pain or diarrhea. He denies any rash or wound. There is no lightheadedness, change in vision or hearing, or focal numbness or weakness.  Clinical Impression  Pt is a 60 yo male who normally ambulates with a cane.  He has been admitted to [facility] for weakness and states he is to go home tomorrow. Pt was no steady with a cane but demonstrates improved mobility with a walker; therefore therapist recommends that the pt returns home with a rolling walker.  Due to unsteadiness on feet a shower chair is also recommended.  [name] states he does not want to go to outpatient physical therapy at this time he would prefer to increase his activity level on his own.     Follow Up  Recommendations No PT follow up    Equipment Recommendations  Rolling walker with 5" wheels;3in1 (PT)    Recommendations for Other Services       Precautions / Restrictions Precautions Precautions: None Restrictions Weight Bearing Restrictions: No      Mobility  Bed Mobility Overal bed mobility: Modified Independent                Transfers Overall transfer level: Modified independent                  Ambulation/Gait Ambulation/Gait assistance: Modified independent (Device/Increase time) Ambulation Distance (Feet): 160 Feet Assistive device: Rolling walker (2 wheeled) Gait Pattern/deviations: Trunk flexed;Decreased step length - left;Decreased step length - right     General Gait Details: began with single point cane but pt was not stable   Stairs            Wheelchair Mobility    Modified Rankin (Stroke Patients Only)       Balance                                             Pertinent Vitals/Pain Pain Assessment: No/denies pain    Home Living Family/patient expects to be discharged to:: Private residence Living Arrangements: Spouse/significant other Available Help at Discharge: Family Type of Home: Mobile home Home Access: Stairs to enter Entrance Stairs-Rails: Right Entrance Stairs-Number of Steps: 3 Home Layout: One level Home Equipment: Cane - single point      Prior Function  Level of Independence: Independent with assistive device(s)               Hand Dominance        Extremity/Trunk Assessment               Lower Extremity Assessment: Generalized weakness         Communication   Communication: No difficulties  Cognition Arousal/Alertness: Awake/alert Behavior During Therapy: WFL for tasks assessed/performed Overall Cognitive Status: Within Functional Limits for tasks assessed                      General Comments      Exercises General Exercises - Lower  Extremity Heel Slides: 10 reps (bridge) Hip ABduction/ADduction: Strengthening;Both;10 reps Straight Leg Raises: Strengthening;Both;5 reps   Assessment/Plan    PT Assessment Patent does not need any further PT services  PT Problem List            PT Treatment Interventions      PT Goals (Current goals can be found in the Care Plan section)  Acute Rehab PT Goals Patient Stated Goal: to go home  PT Goal Formulation: With patient Time For Goal Achievement: 07/06/16 Potential to Achieve Goals: Good        One time treatment as pt is going home tomorrow.        End of Session Equipment Utilized During Treatment: Gait belt Activity Tolerance: Patient tolerated treatment well Patient left: in bed;with call bell/phone within reach;with family/visitor present      Functional Limitation: Mobility: Walking and moving around Mobility: Walking and Moving Around Current Status [id number]): At least 20 percent but less than 40 percent impaired, limited or restricted Mobility: Walking and Moving Around Goal Status [phone]): At least 20 percent but less than 40 percent impaired, limited or restricted Mobility: Walking and Moving Around Discharge Status [phone]): At least 20 percent but less than 40 percent impaired, limited or restricted    Time: 1550-1620 PT Time Calculation (min) (ACUTE ONLY): 30 min   Charges:   PT Evaluation $PT Eval Low Complexity: 1 Procedure     PT G Codes:   PT G-Codes **NOT FOR INPATIENT CLASS** Functional Limitation: Mobility: Walking and moving around Mobility: Walking and Moving Around Current Status [id number]): At least 20 percent but less than 40 percent impaired, limited or restricted Mobility: Walking and Moving Around Goal Status [phone]): At least 20 percent but less than 40 percent impaired, limited or restricted Mobility: Walking and Moving Around Discharge Status [phone]): At least 20 percent but less than 40 percent impaired, limited or restricted    [name], PT CLT [phone] 07/05/2016, 4:20 PM

## 2016-07-05 NOTE — Progress Notes (Signed)
Nutrition Follow-up   INTERVENTION:   Regular diet   Offer snacks between meals ad lib   NUTRITION DIAGNOSIS:   Increased nutrient needs; ongoing given pt metastatic cancer  GOAL:  Revised from last admission. Honor Mr Teo's wishes for his healthcare/nutrition management.     MONITOR:  Care decisions as he and the family discuss their goals    REASON FOR ASSESSMENT:  Malnutrition Screen - Pt just fully assessed by RD on 10/3 and follow up on 10/6.     ASSESSMENT:  Mr Heinzel is a 60 yo male with metastatic pancreatic cancer (stage IV). His c/o nausea and vomiting prior to admission which he relates to his cancer tx. Palliative talked with them yesterday and if to follow up today to help with goals of care and providing support.  He is eating lunch during my visit and says his lunch is going well and doesn't feel nauseated. One of his son's is here and his spouse. According to his hospital records his weight is up 5 kg compared to last Friday. He is on daily weight monitoring.  Unable to complete Nutrition-Focused physical exam at this time. Pt is up fully dressed and eating lunch.   Recent Labs Lab 07/01/16 0843 07/04/16 0848 07/04/16 1809 07/05/16 0639  NA 133* 135 136 135  K 4.3 4.1 3.7 3.4*  CL 96* 98* 99* 103  CO2 24 26  --  25  BUN 29* 42* 37* 29*  CREATININE 2.67* 2.38* 2.30* 1.30*  CALCIUM 8.7* 8.8*  --  7.6*  GLUCOSE 156* 118* 41* 46*   Labs: Albumin 2.3 (low), BUN 29, Cr. 1.3, glucose 46.  Meds: insulin,lipitor, decadron  Diet Order:  Diet regular Room service appropriate? Yes; Fluid consistency: Thin  Skin:   intact  Last BM:   prior to admission  Height:   Ht Readings from Last 1 Encounters:  07/04/16 5\' 9"  (1.753 m)   Weight:   Wt Readings from Last 1 Encounters:  07/05/16 212 lb 8 oz (96.4 kg)  10/6- 91.4 kg. Pt on daily weights.  Ideal Body Weight:   73 kg  BMI:  Body mass index is 31.38 kg/m.  Estimated Nutritional Needs:    Kcal:   2000-2200  Protein:   90-105 gr  Fluid:   >2 liters  EDUCATION NEEDS: RD provided last week   Colman Cater MS,RD,CSG,LDN Office: [phone] Pager: [phone]

## 2016-07-05 NOTE — Progress Notes (Signed)
Daily Progress Note   Patient Name: Jake Gonzalez       Date: 07/05/2016 DOB: 1956/06/16  Age: 60 y.o. MRN#: [id number] Attending Physician: Hosie Poisson, MD Primary Care Physician: Glenda Chroman, MD Admit Date: 07/04/2016  Reason for Consultation/Follow-up: Disposition, Hospice Evaluation and Psychosocial/spiritual support  Subjective: Jake Gonzalez is resting quietly in his room. His wife Jake Gonzalez are and bedside. They share that they feel his pain is well-controlled at this time. As we talk, Jake Gonzalez wakes. We talk more about his symptom management, pain is well-controlled. We talk about increasing fentanyl patch by 25 g every 3 day as needed, with the goal of reducing the frequency of PRN/breakthrough pain medication. I share that Jake Gonzalez will be able to provide all support they need. We also talk about anxiety medications, I encourage Jake Gonzalez to take these at night to help with sleeping. We review medications in detail, and I encourage Jake Gonzalez to think about benefit versus burden of medication use. We talk about the use of 3 medications to control diabetes, and that he may be as satisfied with less tight blood sugar control at this time. We also talk about the use of medications for cholesterol management. I share that, as time goes, he may choose to unburden himself from a detailed medication regimen.  We talk about advanced directives, we talk about the realities of CPR and intubation. Jake Gonzalez elects allow a natural death, his family hears and is in agreement with his choice.  We talk about the benefits of hospice in detail.  Jake Gonzalez asks about home health services for fluid administration. I share that home health services will not provide fluid administration without a  doctor's order, but that Jake Gonzalez has agreed to manage Jake Gonzalez's symptoms, and has a standing appointment for him every Friday. I share that he may, at some point in the future, decide that he no longer wants to go to doctors appointments, have lab draws, do any testing, take medications. I share that he will be supported in his decisions. We do not talk about prognosis today.   Length of Stay: 1  Current Medications: Scheduled Meds:  . apixaban  5 mg Oral QHS  . atorvastatin  10 mg Oral q1800  . dexamethasone  4 mg Oral  BID WC  . [START ON 07/07/2016] fentaNYL  50 mcg Transdermal Q72H  . gemfibrozil  600 mg Oral Daily  . insulin aspart  0-9 Units Subcutaneous TID WC  . levothyroxine  25 mcg Oral QAC breakfast  . nystatin  5 mL Oral QID  . sodium chloride flush  3 mL Intravenous Q12H    Continuous Infusions:    PRN Meds: acetaminophen **OR** acetaminophen, albuterol, bisacodyl, haloperidol, HYDROcodone-acetaminophen, HYDROmorphone (DILAUDID) injection, LORazepam, ondansetron **OR** ondansetron (ZOFRAN) IV, polyethylene glycol  Physical Exam  Constitutional: He is oriented to person, place, and time. No distress.  Sleepy  HENT:  Head: Normocephalic and atraumatic.  Cardiovascular: Normal rate and regular rhythm.   Pulmonary/Chest: Effort normal. No respiratory distress.  Abdominal: Soft. He exhibits distension. There is no guarding.  Neurological: He is oriented to person, place, and time.  Sleepy  Skin: Skin is warm and dry.  Nursing note and vitals reviewed.           Vital Signs: BP 136/73 (BP Location: Right Arm)   Pulse 84   Temp 98.5 F (36.9 C) (Oral)   Resp 18   Ht 5\' 9"  (1.753 m)   Wt 96.4 kg (212 lb 8 oz)   SpO2 95%   BMI 31.38 kg/m  SpO2: SpO2: 95 % O2 Device: O2 Device: Not Delivered O2 Flow Rate:    Intake/output summary:  Intake/Output Summary (Last 24 hours) at 07/05/16 1653 Last data filed at 07/05/16 1316  Gross per 24 hour  Intake               681 ml  Output                0 ml  Net              681 ml   LBM:   Baseline Weight: Weight: 92.5 kg (204 lb) Most recent weight: Weight: 96.4 kg (212 lb 8 oz)       Palliative Assessment/Data:    Flowsheet Rows   Flowsheet Row Most Recent Value  Intake Tab  Referral Department  Oncology  Unit at Time of Referral  Oncology Unit  Palliative Care Primary Diagnosis  Cancer  Date Notified  07/04/16  Palliative Care Type  New Palliative care  Reason for referral  End of Life Care Assistance, Clarify Goals of Care, Advance Care Planning  Date of Admission  07/04/16  Date first seen by Palliative Care  07/04/16  # of days Palliative referral response time  0 Day(s)  # of days IP prior to Palliative referral  0  Clinical Assessment  Palliative Performance Scale Score  50%  Pain Max last 24 hours  Not able to report  Pain Min Last 24 hours  Not able to report  Dyspnea Max Last 24 Hours  Not able to report  Dyspnea Min Last 24 hours  Not able to report  Psychosocial & Spiritual Assessment  Palliative Care Outcomes  Patient/Family meeting held?  Yes  Who was at the meeting?  wife, Jake Gonzalez, sons Jake Gonzalez and Jake Gonzalez, Jake Gonzalez   Palliative Care follow-up planned  -- [follow up wile at [facility]]      Patient Active Problem List   Diagnosis Date Noted  . Hypotension 07/04/2016  . Neutropenia ([place]) 07/04/2016  . Normocytic anemia 07/04/2016  . AKI (acute kidney injury) ([place]) 07/04/2016  . Oral candidiasis 07/04/2016  . Diabetes mellitus type II, non insulin dependent ([place]) 07/04/2016  . Hypoglycemia 07/04/2016  .  Deep vein thrombosis (DVT) of left lower extremity ([place]) 07/04/2016  . Palliative care encounter   . DNR (do not resuscitate) discussion   . Goals of care, counseling/discussion   . Malignant neoplasm of body of pancreas ([place])   . Dehydration   . Chemotherapy induced nausea and vomiting   . Septic shock ([place]) 06/27/2016  . Sepsis ([place]) 06/27/2016  . CAD (coronary  artery disease) 06/27/2016  . Hypertension 06/27/2016  . High cholesterol 06/27/2016  . Pancreatic cancer metastasized to liver ([place]) 06/21/2016  . Pancreatic mass 06/12/2016    Palliative Care Assessment & Plan   Patient Profile: 60 y.o. 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  Assessment: Pain, considering increasing fentanyl patch by 25 mcg as needed to reduce PRN med use.  Anxiety, I encourage use of anxiety medications at night for sleep and PRN Hospice, We discuss the benefits of hospice for comfort and dignity.    Recommendations/Plan:  Return home with family support. We talk about follow up with Jake Gonzalez for ALL needs.   Goals of Care and Additional Recommendations:  Limitations on Scope of Treatment: Mr. Fieser states he would not want any more chemo. He is considering code status and hospice  Code Status: -  We discuss AD and Mr. Stmarie elects to allow a natural death.     Code Status Orders        Start     Ordered   07/04/16 2044  Full code  Continuous     07/04/16 2046    Code Status History    Date Active Date Inactive Code Status Order ID Comments User Context   This patient has a current code status but no historical code status.       Prognosis:   < 3 months, likely dt aggressive pancreatic cancer.   Discharge Planning:  home with family support, weekly follow ups with Jake Gonzalez.   Care plan was discussed with nursing staff, CM and Dr. Karleen Hampshire on next rounds.   Thank you for allowing the Palliative Medicine Team to assist in the care of this patient.   Time In: 1430 Time Out: 1510 Total Time 40 minutes Prolonged Time Billed  no       Greater than 50%  of this time was spent counseling and coordinating care related to the above assessment and  plan.  Drue Novel, NP  Please contact Palliative Medicine Team phone at [phone] for questions and concerns.

## 2016-07-05 NOTE — Progress Notes (Signed)
PROGRESS NOTE    Jake Gonzalez  [id number] DOB: Jan 20, 1956 DOA: 07/04/2016 PCP: Glenda Chroman, MD    Brief Narrative: Jake Gonzalez is a 60 y.o. male with medical history significant for type 2 diabetes mellitus, hypertension, left lower extremity DVT, and recent diagnosis of pancreatic cancer metastatic to the liver who presents from the cancer center for evaluation of hypotension.   Assessment & Plan:   Principal Problem:   Hypotension Active Problems:   Pancreatic cancer metastasized to liver [facility])   CAD (coronary artery disease)   Hypertension   Neutropenia ([facility])   Normocytic anemia   AKI (acute kidney injury) ([place])   Oral candidiasis   Diabetes mellitus type II, non insulin dependent ([facility])   Hypoglycemia   Deep vein thrombosis (DVT) of left lower extremity ([facility])   Dehydration Hypotension: Probably secondary to poor po intake, vomiting and dehydration.  No source of infection so far.  Resolved with hydration.  Recommend to continue hydration for another 24 hours.    Acute kidney Injury: - improved with hydration.  - probably from prerenal azotemia.  - hold lisinopril, continue to monitor renal parameters.   Hypoglycemia: Stop all oral hypoglyemic medications.  hgba1c ordered/ pending.  Metastatic pancreatic cancer: Further management as per oncology, Dr Whitney Muse. . Palliative consulted.    Left lower extremity DVT: resume eliquis      DVT prophylaxis: Eliquis.  Code Status: DNR.  Family Communication: family at bedside.  Disposition Plan: pending further eval.    Consultants:   None.    Procedures: none  Antimicrobials: none.   Subjective: No new complaints.   Objective: Vitals:   07/04/16 2030 07/04/16 2052 07/05/16 0500 07/05/16 1546  BP: 104/58 118/62 115/62 136/73  Pulse: 97 (!) 101 90 84  Resp: 16 18 18 18   Temp:  98.6 F (37 C) 98.3 F (36.8 C) 98.5 F (36.9 C)  TempSrc:  Oral Oral Oral  SpO2: 96% 94% 94% 95%  Weight:   96.2 kg (212 lb 1.3 oz) 96.4 kg (212 lb 8 oz)   Height:  5\' 9"  (1.753 m)      Intake/Output Summary (Last 24 hours) at 07/05/16 1758 Last data filed at 07/05/16 1316  Gross per 24 hour  Intake              921 ml  Output                0 ml  Net              921 ml   Filed Weights   07/04/16 1607 07/04/16 2052 07/05/16 0500  Weight: 92.5 kg (204 lb) 96.2 kg (212 lb 1.3 oz) 96.4 kg (212 lb 8 oz)    Examination:  General exam: Appears calm and comfortable  Respiratory system: Clear to auscultation. Respiratory effort normal. Cardiovascular system: S1 & S2 heard, RRR. No JVD, murmurs, rubs, gallops or clicks. No pedal edema. Gastrointestinal system: Abdomen is nondistended, soft and nontender. No organomegaly or masses felt. Normal bowel sounds heard. Central nervous system: Alert and oriented. No focal neurological deficits. Extremities: Symmetric 5 x 5 power. Skin: No rashes, lesions or ulcers     Data Reviewed: I have personally reviewed following labs and imaging studies  CBC:  Recent Labs Lab 06/29/16 0617 07/01/16 0843 07/04/16 0848 07/04/16 1809 07/05/16 0639  WBC 4.4 2.0* 3.1*  --  4.5  NEUTROABS 3.4 1.0* 0.3*  --  1.4*  HGB 9.7* 10.5* 11.3* 11.2*  9.7*  HCT 30.3* 33.1* 35.0* 33.0* 30.3*  MCV 85.8 86.2 85.8  --  85.6  PLT 88* 78* 161  --  [id number]*   Basic Metabolic Panel:  Recent Labs Lab 06/29/16 0617 07/01/16 0843 07/04/16 0848 07/04/16 1809 07/05/16 0639  NA 134* 133* 135 136 135  K 3.6 4.3 4.1 3.7 3.4*  CL 100* 96* 98* 99* 103  CO2 25 24 26   --  25  GLUCOSE 136* 156* 118* 41* 46*  BUN 10 29* 42* 37* 29*  CREATININE 0.57* 2.67* 2.38* 2.30* 1.30*  CALCIUM 7.6* 8.7* 8.8*  --  7.6*   GFR: Estimated Creatinine Clearance: 68.4 mL/min (by C-G formula based on SCr of 1.3 mg/dL (H)). Liver Function Tests:  Recent Labs Lab 07/01/16 0843 07/04/16 0848 07/05/16 0639  AST 50* 48* 32  ALT 40 30 20  ALKPHOS 127* 160* 127*  BILITOT 0.9 0.7 0.7  PROT  6.9 6.4* 5.4*  ALBUMIN 3.0* 2.9* 2.3*   No results for input(s): LIPASE, AMYLASE in the last 168 hours. No results for input(s): AMMONIA in the last 168 hours. Coagulation Profile: No results for input(s): INR, PROTIME in the last 168 hours. Cardiac Enzymes: No results for input(s): CKTOTAL, CKMB, CKMBINDEX, TROPONINI in the last 168 hours. BNP (last 3 results) No results for input(s): PROBNP in the last 8760 hours. HbA1C: No results for input(s): HGBA1C in the last 72 hours. CBG:  Recent Labs Lab 07/04/16 2127 07/05/16 0743 07/05/16 0959 07/05/16 1118 07/05/16 1617  GLUCAP 94 45* 101* 151* 219*   Lipid Profile: No results for input(s): CHOL, HDL, LDLCALC, TRIG, CHOLHDL, LDLDIRECT in the last 72 hours. Thyroid Function Tests: No results for input(s): TSH, T4TOTAL, FREET4, T3FREE, THYROIDAB in the last 72 hours. Anemia Panel: No results for input(s): VITAMINB12, FOLATE, FERRITIN, TIBC, IRON, RETICCTPCT in the last 72 hours. Sepsis Labs:  Recent Labs Lab 07/04/16 1809  LATICACIDVEN 1.43    Recent Results (from the past 240 hour(s))  Culture, blood (Routine X 2)     Status: None   Collection Time: 06/27/16  9:45 AM  Result Value Ref Range Status   Specimen Description BLOOD RIGHT ARM DRAWN BY RN R.M.  Final   Special Requests   Final    BOTTLES DRAWN AEROBIC AND ANAEROBIC AEB=8CC ANA=6CC   Culture NO GROWTH 5 DAYS  Final   Report Status 07/02/2016 FINAL  Final  Culture, blood (Routine X 2)     Status: None   Collection Time: 06/27/16  9:54 AM  Result Value Ref Range Status   Specimen Description BLOOD LEFT ANTECUBITAL  Final   Special Requests BOTTLES DRAWN AEROBIC AND ANAEROBIC 10CC EACH  Final   Culture NO GROWTH 5 DAYS  Final   Report Status 07/02/2016 FINAL  Final  Urine culture     Status: None   Collection Time: 06/27/16  2:30 PM  Result Value Ref Range Status   Specimen Description URINE, RANDOM  Final   Special Requests NONE  Final   Culture NO  GROWTH Performed at [facility]   Final   Report Status 06/29/2016 FINAL  Final  MRSA PCR Screening     Status: None   Collection Time: 06/27/16  4:21 PM  Result Value Ref Range Status   MRSA by PCR NEGATIVE NEGATIVE Final    Comment:        The GeneXpert MRSA Assay (FDA approved for NASAL specimens only), is one component of a comprehensive MRSA colonization surveillance program.  It is not intended to diagnose MRSA infection nor to guide or monitor treatment for MRSA infections.   Culture, blood (Routine X 2) w Reflex to ID Panel     Status: None (Preliminary result)   Collection Time: 07/04/16  6:01 PM  Result Value Ref Range Status   Specimen Description BLOOD LEFT ARM  Final   Special Requests BOTTLES DRAWN AEROBIC AND ANAEROBIC 6CC  Final   Culture NO GROWTH < 24 HOURS  Final   Report Status PENDING  Incomplete  Culture, blood (Routine X 2) w Reflex to ID Panel     Status: None (Preliminary result)   Collection Time: 07/04/16  6:04 PM  Result Value Ref Range Status   Specimen Description BLOOD LEFT HAND  Final   Special Requests BOTTLES DRAWN AEROBIC AND ANAEROBIC 6CC  Final   Culture NO GROWTH < 24 HOURS  Final   Report Status PENDING  Incomplete  Urine culture     Status: None (Preliminary result)   Collection Time: 07/04/16  7:15 PM  Result Value Ref Range Status   Specimen Description URINE, CLEAN CATCH  Final   Special Requests NONE  Final   Culture PENDING  Incomplete   Report Status PENDING  Incomplete         Radiology Studies: Dg Chest Port 1 View  Result Date: 07/04/2016 CLINICAL DATA:  Initial evaluation for acute hypotension. EXAM: PORTABLE CHEST 1 VIEW COMPARISON:  Prior radiograph from 06/27/2016. FINDINGS: Extent to a shin of the cardiac silhouette related AP technique. Mediastinal silhouette within normal limits. Aortic atherosclerosis noted. Lungs mildly hypoinflated. Mild pulmonary vascular congestion without overt pulmonary edema. No  pleural effusion. No pneumothorax. No definite focal infiltrates. No acute osseous abnormality. IMPRESSION: Shallow lung inflation with mild diffuse pulmonary vascular congestion without overt pulmonary edema. Electronically Signed   By: Jeannine Boga M.D.   On: 07/04/2016 17:57        Scheduled Meds: . apixaban  5 mg Oral QHS  . atorvastatin  10 mg Oral q1800  . dexamethasone  4 mg Oral BID WC  . [START ON 07/07/2016] fentaNYL  50 mcg Transdermal Q72H  . gemfibrozil  600 mg Oral Daily  . insulin aspart  0-9 Units Subcutaneous TID WC  . levothyroxine  25 mcg Oral QAC breakfast  . nystatin  5 mL Oral QID  . sodium chloride flush  3 mL Intravenous Q12H   Continuous Infusions:    LOS: 1 day    Time spent: 30 minutes.     Hosie Poisson, MD [facility] Pager [phone]  If 7PM-7AM, please contact night-coverage [email or web address] Password [email or web address] 07/05/2016, 5:58 PM

## 2016-07-06 ENCOUNTER — Other Ambulatory Visit (HOSPITAL_COMMUNITY): Payer: Self-pay | Admitting: Pharmacist

## 2016-07-06 DIAGNOSIS — I959 Hypotension, unspecified: Principal | ICD-10-CM

## 2016-07-06 DIAGNOSIS — D649 Anemia, unspecified: Secondary | ICD-10-CM

## 2016-07-06 LAB — GLUCOSE, CAPILLARY
GLUCOSE-CAPILLARY: 230 mg/dL — AB (ref 65–99)
Glucose-Capillary: 154 mg/dL — ABNORMAL HIGH (ref 65–99)

## 2016-07-06 LAB — URINE CULTURE: Culture: NO GROWTH

## 2016-07-06 LAB — HEMOGLOBIN A1C
HEMOGLOBIN A1C: 7.7 % — AB (ref 4.8–5.6)
MEAN PLASMA GLUCOSE: 174 mg/dL

## 2016-07-06 MED ORDER — ONDANSETRON HCL 4 MG/2ML IJ SOLN
4.0000 mg | Freq: Two times a day (BID) | INTRAMUSCULAR | Status: DC
Start: 2016-07-06 — End: 2016-07-06

## 2016-07-06 MED ORDER — HYDROMORPHONE HCL 2 MG PO TABS: 2.0000 mg | ORAL_TABLET | ORAL | Status: DC | PRN

## 2016-07-06 MED ORDER — POTASSIUM CHLORIDE CRYS ER 20 MEQ PO TBCR
20.0000 meq | EXTENDED_RELEASE_TABLET | Freq: Every day | ORAL | Status: AC
  Administered 2016-07-06: 20 meq via ORAL
  Filled 2016-07-06: qty 1

## 2016-07-06 MED ORDER — FENTANYL 75 MCG/HR TD PT72: 75.0000 ug | MEDICATED_PATCH | TRANSDERMAL | 0 refills | Status: AC

## 2016-07-06 MED ORDER — FENTANYL 75 MCG/HR TD PT72
75.0000 ug | MEDICATED_PATCH | TRANSDERMAL | Status: DC
  Administered 2016-07-06: 75 ug via TRANSDERMAL
  Filled 2016-07-06: qty 1

## 2016-07-06 MED ORDER — ONDANSETRON HCL 4 MG PO TABS
4.0000 mg | ORAL_TABLET | Freq: Two times a day (BID) | ORAL | Status: DC
  Administered 2016-07-06: 4 mg via ORAL
  Filled 2016-07-06: qty 1

## 2016-07-06 MED ORDER — ONDANSETRON HCL 4 MG PO TABS: 4.0000 mg | ORAL_TABLET | Freq: Three times a day (TID) | ORAL | Status: DC

## 2016-07-06 MED ORDER — HYDROMORPHONE HCL 2 MG PO TABS: 2.0000 mg | ORAL_TABLET | ORAL | 0 refills | Status: AC | PRN

## 2016-07-06 MED ORDER — SENNOSIDES-DOCUSATE SODIUM 8.6-50 MG PO TABS
2.0000 | ORAL_TABLET | Freq: Two times a day (BID) | ORAL | Status: DC
  Administered 2016-07-06: 2 via ORAL
  Filled 2016-07-06: qty 2

## 2016-07-06 MED ORDER — SENNOSIDES-DOCUSATE SODIUM 8.6-50 MG PO TABS: 2.0000 | ORAL_TABLET | Freq: Two times a day (BID) | ORAL | Status: AC

## 2016-07-06 NOTE — Progress Notes (Signed)
0240 central telemetry called to report ventricular bigeminy on this pt, strip saved. MD notified.

## 2016-07-06 NOTE — Care Management Note (Signed)
Case Management Note  Patient Details  Name: Jake Gonzalez MRN: [id number] Date of Birth: 03-14-1956  Subjective/Objective:                  Pt is from home, lives with his wife and is ind with ADL's. Pt has made decision to stop chemo and has had consult with palliative medicine. Pt has decided on Hospice services in the home. He lives in [place] and has requested [facility] Referral has been faxed and pt anticipates DC home today. Hospice will see pt at his home when he arrives. PT has recommended pt use [facility] and [facility]. Pt has chosen [facility] from list of DME providers. Romualdo Bolk, of [facility], is aware of referral and will obtain pt info from chart and deliver DME to pt room prior to DC.   Action/Plan: Anticipate DC in next 24 hrs. Will provide [facility] with DC summary when available.   Expected Discharge Date:     07/06/2016             Expected Discharge Plan:  [place] Referral:  Hospice / Palliative Care  Discharge planning Services  CM Consult  Post Acute Care Choice:  Hospice Choice offered to:  Patient  DME Arranged:    Vassie Moselle, [facility] DME Agency:    [place] Arranged:    [facility]ospice [facility] Agency:   [facility])  Status of Service:  Completed, signed off  Sherald Barge, RN 07/06/2016, 11:34 AM

## 2016-07-06 NOTE — Progress Notes (Addendum)
Daily Progress Note   Patient Name: Jake Gonzalez       Date: 07/06/2016 DOB: Aug 01, 1956  Age: 60 y.o. MRN#: [id number] Attending Physician: Rexene Alberts, MD Primary Care Physician: Glenda Chroman, MD Admit Date: 07/04/2016  Reason for Consultation/Follow-up: Disposition, Establishing goals of care, Hospice Evaluation, Non pain symptom management, Pain control and Psychosocial/spiritual support  Subjective: Mr. Jake Gonzalez is resting quietly in bed he greets me making and keeping eye contact. Present today is wife and, and sons Denice Paradise and [place]. We again discussed their choices. I share group diagram of home health services vs hospice as time goes along. I also share a diagram of the chronic illness pathway, and what is expected. We talk about what is expected, physical changes, that occur as we get closer to end-of-life. Jake Gonzalez states several times that he has not had time to plan or prepare. I share with him that this has been sudden.   We talk in detail about the benefits of hospice. Mr. Jake Gonzalez states that his mother passed in her own home with hospice. I ask him where he would like to be when his time comes to pass. He states his preference is to die at home, wife and states that she feels she can manage this. Son Jake Gonzalez states that their grandmother seemed peaceful and passing with hospice. We talk again in detail about the natural changes that occur as people come closer to end-of-life. We talk about the benefits of home health versus hospice, that hospice will not allow IV fluids. We talk about the falses plumping that comes with IV fluids. People return home, continue to dehydrate, and that this is a vicious cycle. Also share that the physician may be unwilling to continue IV fluids for an  extended period of time, "this is not a life-prolonging measure".  Sons Jake Gonzalez and Jake Gonzalez have many questions about medication management. We talk in detail about choices, pain management, support systems. I share that I expect his need for pain medication to increase. I encourage Jake Gonzalez to lean on his loved ones if he is unable to make decisions, to lean on their support, accept their choices for him. He is tearful.   Sons and I meet outside the door. We talk about prognosis, 6 to 8 weeks, or less.  Jake Gonzalez asks  how long we should expect the IV fluid to keep him comfortable, stating it helped for 3 or 4 days after the last hospitalization, and then he felt miserable. I share with him that he is unlikely to ever feel good again. I encourage them to call me if needed, and also Dr. Whitney Muse. Sons state that they will accept hospice at this time, "we will convince him".  Length of Stay: 2  Current Medications: Scheduled Meds:  . apixaban  5 mg Oral QHS  . dexamethasone  4 mg Oral BID WC  . fentaNYL  75 mcg Transdermal Q72H  . insulin aspart  0-9 Units Subcutaneous TID WC  . nystatin  5 mL Oral QID  . ondansetron  4 mg Oral Q12H   Or  . ondansetron (ZOFRAN) IV  4 mg Intravenous Q12H  . ondansetron  4 mg Oral Q8H  . sodium chloride flush  3 mL Intravenous Q12H    Continuous Infusions:    PRN Meds: acetaminophen **OR** acetaminophen, albuterol, bisacodyl, haloperidol, HYDROcodone-acetaminophen, HYDROmorphone (DILAUDID) injection, LORazepam, polyethylene glycol  Physical Exam  Constitutional: He is oriented to person, place, and time. No distress.  HENT:  Head: Normocephalic and atraumatic.  Cardiovascular: Normal rate and regular rhythm.   Pulmonary/Chest: Effort normal. No respiratory distress.  Abdominal: Soft. He exhibits no distension.  Neurological: He is alert and oriented to person, place, and time.  Skin: Skin is warm and dry.  Nursing note and vitals reviewed.           Vital  Signs: BP 139/84 (BP Location: Right Arm)   Pulse 85   Temp 97.7 F (36.5 C) (Oral)   Resp 17   Ht 5\' 9"  (1.753 m)   Wt 96.4 kg (212 lb 8 oz)   SpO2 98%   BMI 31.38 kg/m  SpO2: SpO2: 98 % O2 Device: O2 Device: Not Delivered O2 Flow Rate:    Intake/output summary:  Intake/Output Summary (Last 24 hours) at 07/06/16 1310 Last data filed at 07/05/16 1316  Gross per 24 hour  Intake              243 ml  Output                0 ml  Net              243 ml   LBM: Last BM Date: 07/06/16 Baseline Weight: Weight: 92.5 kg (204 lb) Most recent weight: Weight: 96.4 kg (212 lb 8 oz)       Palliative Assessment/Data:    Flowsheet Rows   Flowsheet Row Most Recent Value  Intake Tab  Referral Department  Oncology  Unit at Time of Referral  Oncology Unit  Palliative Care Primary Diagnosis  Cancer  Date Notified  07/04/16  Palliative Care Type  New Palliative care  Reason for referral  End of Life Care Assistance, Clarify Goals of Care, Advance Care Planning  Date of Admission  07/04/16  Date first seen by Palliative Care  07/04/16  # of days Palliative referral response time  0 Day(s)  # of days IP prior to Palliative referral  0  Clinical Assessment  Palliative Performance Scale Score  50%  Pain Max last 24 hours  Not able to report  Pain Min Last 24 hours  Not able to report  Dyspnea Max Last 24 Hours  Not able to report  Dyspnea Min Last 24 hours  Not able to report  Psychosocial & Spiritual Assessment  Palliative  Care Outcomes  Patient/Family meeting held?  Yes  Who was at the meeting?  wife, Webb Silversmith, sons Denice Paradise and Jake Gonzalez, brother Jake Gonzalez   Palliative Care follow-up planned  -- [follow up wile at [facility]]      Patient Active Problem List   Diagnosis Date Noted  . Counseling regarding advanced care planning and goals of care   . Hypotension 07/04/2016  . Neutropenia ([place]) 07/04/2016  . Normocytic anemia 07/04/2016  . AKI (acute kidney injury) ([place]) 07/04/2016  . Oral  candidiasis 07/04/2016  . Diabetes mellitus type II, non insulin dependent ([place]) 07/04/2016  . Hypoglycemia 07/04/2016  . Deep vein thrombosis (DVT) of left lower extremity ([place]) 07/04/2016  . Palliative care encounter   . DNR (do not resuscitate) discussion   . Goals of care, counseling/discussion   . Malignant neoplasm of body of pancreas ([place])   . Dehydration   . Chemotherapy induced nausea and vomiting   . Septic shock ([place]) 06/27/2016  . Sepsis ([place]) 06/27/2016  . CAD (coronary artery disease) 06/27/2016  . Hypertension 06/27/2016  . High cholesterol 06/27/2016  . Pancreatic cancer metastasized to liver ([place]) 06/21/2016  . 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  Assessment: Return home with [facility]. Follow up with Dr. Whitney Muse for ALL needs.  Pain, increase fentanyl patch to 75 g Q3 days. Anticipate need for further increases. Breakthrough pain, Dilauded 2 mg PO Q4 hours PRN breakthrough pain.  Anxiety, Ativan 0.5 mg PO Q 4 hours PRN, encourage use at PM for sleep.  Recommendations/Plan:  home with the benefits of hospice of [facility].   Goals of Care and Additional Recommendations:  Limitations on Scope of Treatment: Mr. Swann states "no more chemo". We talk about the benefits of hospice, and he and his family have elected this service. We talk about the limitations, hospice will not providing IV fluids. We talk about the benefits of IV fluids, and the "false plumping" associated.  Code Status:    Code Status Orders        Start     Ordered   07/05/16 1656  Do not attempt resuscitation (DNR)  Continuous    Question Answer Comment  In the event of cardiac or  respiratory ARREST Do not call a "code blue"   In the event of cardiac or respiratory ARREST Do not perform Intubation, CPR, defibrillation or ACLS   In the event of cardiac or respiratory ARREST Use medication by any route, position, wound care, and other measures to relive pain and suffering. May use oxygen, suction and manual treatment of airway obstruction as needed for comfort.      07/05/16 1656    Code Status History    Date Active Date Inactive Code Status Order ID Comments User Context   07/04/2016  8:46 PM 07/05/2016  4:56 PM Full Code [id number]  Vianne Bulls, MD Inpatient       Prognosis:   < 6 weeks, likely based on aggressive pancreatic cancer.  Discharge Planning:  Home with [facility] plan was discussed with nursing staff, case manager, Norva Karvonen D., and Dr. Caryn Section.  Thank you for allowing the Palliative Medicine Team to assist in the care of this patient.   Time In: 1000  Time Out: 1100 Total Time 60 minutes Prolonged Time Billed  yes       Greater than 50%  of this time was spent counseling and coordinating care related to the above assessment and plan.  Drue Novel, NP  Please contact Palliative Medicine Team phone at [phone] for questions and concerns.

## 2016-07-06 NOTE — Discharge Summary (Signed)
Physician Discharge Summary  BRADD MERLOS SAY:[id number] DOB: March 08, 1956 DOA: 07/04/2016  PCP: Glenda Chroman, MD  Admit date: 07/04/2016 Discharge date: 07/06/2016  Time spent: Greater than 30 minutes  Recommendations for Outpatient Follow-up:  1. Hospice for home was ordered at the time of discharge.     Discharge Diagnoses:  1. Hypotension secondary to hypovolemia. 2. Acute kidney injury secondary to hypoperfusion and prerenal azotemia. 3. Diabetes mellitus, type II. 4. Hypoglycemia secondary to oral hypoglycemic agents. 5. Mild leukopenia, secondary to chemotherapy. 6. Anemia of chronic disease. 7. Oral candidiasis. 8. CAD. 9. History of DVT, on Eliquis.  Discharge Condition: Improved, but terminal.  Diet recommendation: Any diet as tolerated.  Filed Weights   07/04/16 1607 07/04/16 2052 07/05/16 0500  Weight: 92.5 kg (204 lb) 96.2 kg (212 lb 1.3 oz) 96.4 kg (212 lb 8 oz)    History of present illness:  Patient is a 60 year old man with a history of DM-2, hypertension, LLE DVT, pancreatic cancer with metastasis to the liver-on palliative chemotherapy, who was admitted directly from the [facility] for hypotension and generalized weakness. He had been having some nausea and vomiting. In the ED, he was hypotensive with a blood pressure of 86/56. His chest x-ray revealed vascular congestion without edema. His BUN was elevated at 4.2 and his creatinine was elevated at 2.38, up from 0.6 earlier in the month. His lactic acid level was 1.43. His blood glucose was 41. He was admitted for further evaluation and management.  Hospital Course:  The patient was started on IV fluid hydration and supportive treatment with analgesics and antiemetics as needed. His oral hypoglycemic agents were discontinued. He was given dextrose for treatment of the hypoglycemia. His antihypertensive medications/ACE inhibitor was withheld due to acute kidney injury. Oral nystatin was ordered for mild oral  candidiasis. He was maintained on Eliquis for treatment of left lower extremity DVT.  With supportive treatment and hydration, the patient's renal function improved. He felt better. Due to the persistent hypoglycemia, he was given D50. His CBG improved to the 200s.  Palliative care was consulted. Ms. Hulan Fray met  with the family to discuss goals of care and pain management. Following their discussion, the patient and family were accepting of hospice services at home. Medication management changes included increasing fentanyl patch to 75 g every 72 hours, starting oral Dilaudid 2 mg every 4 hours as needed, and discontinuing all other chronic medications with the exception of Eliquis, antiemetics, Decadron, and albuterol inhaler.  Hospice was ordered prior to the patient's discharge. He was discharged in improved, yet terminal condition.  Procedures:  None  Consultations:  Palliative care  Discharge Exam: Vitals:   07/05/16 2117 07/06/16 0551  BP: (!) 141/73 139/84  Pulse: 91 85  Resp: 20 17  Temp: 98.3 F (36.8 C) 97.7 F (36.5 C)    General: Ill-appearing 60 year old man in no acute distress. Cardiovascular: S1, S2, with no murmurs rubs or gallops. Respiratory: Clear anteriorly. Breathing nonlabored. Abdomen: Mild to moderate tenderness in the epigastrium; positive bowel sounds; no hepatosplenomegaly.  Discharge Instructions   Discharge Instructions    Diet - low sodium heart healthy    Complete by:  As directed    Increase activity slowly    Complete by:  As directed      Current Discharge Medication List    START taking these medications   Details  HYDROmorphone (DILAUDID) 2 MG tablet Take 1 tablet (2 mg total) by mouth every 4 (four) hours as  needed for moderate pain or severe pain. Qty: 30 tablet, Refills: 0    senna-docusate (SENOKOT-S) 8.6-50 MG tablet Take 2 tablets by mouth 2 (two) times daily.      CONTINUE these medications which have CHANGED   Details   fentaNYL (DURAGESIC - DOSED MCG/HR) 75 MCG/HR Place 1 patch (75 mcg total) onto the skin every 3 (three) days. Qty: 5 patch, Refills: 0      CONTINUE these medications which have NOT CHANGED   Details  albuterol (PROVENTIL HFA;VENTOLIN HFA) 108 (90 Base) MCG/ACT inhaler Inhale 2 puffs into the lungs every 6 (six) hours as needed for wheezing or shortness of breath.    apixaban (ELIQUIS) 5 MG TABS tablet Take 5 mg by mouth at bedtime.   Associated Diagnoses: DVT (deep venous thrombosis), left; Pancreatic mass; Liver masses    dexamethasone (DECADRON) 4 MG tablet Take 1 tablet (4 mg total) by mouth 2 (two) times daily with a meal. Qty: 60 tablet, Refills: 1   Associated Diagnoses: Pancreatic cancer metastasized to liver (HCC)    haloperidol (HALDOL) 0.5 MG tablet Take 1 tablet (0.5 mg total) by mouth every 8 (eight) hours as needed (nausea). Qty: 90 tablet, Refills: 0   Associated Diagnoses: Pancreatic cancer metastasized to liver (HCC)    LORazepam (ATIVAN) 0.5 MG tablet Take 1 tablet (0.5 mg total) by mouth every 4 (four) hours as needed for anxiety (and nausea). Qty: 60 tablet, Refills: 0    nystatin (MYCOSTATIN/NYSTOP) powder Apply 1 application topically daily.       STOP taking these medications     atenolol (TENORMIN) 100 MG tablet      atorvastatin (LIPITOR) 10 MG tablet      cilostazol (PLETAL) 100 MG tablet      fentaNYL (DURAGESIC - DOSED MCG/HR) 50 MCG/HR      gemfibrozil (LOPID) 600 MG tablet      glimepiride (AMARYL) 4 MG tablet      lisinopril (PRINIVIL,ZESTRIL) 20 MG tablet      metFORMIN (GLUCOPHAGE) 500 MG tablet      morphine (MS CONTIN) 15 MG 12 hr tablet      morphine (MS CONTIN) 30 MG 12 hr tablet      ondansetron (ZOFRAN) 8 MG tablet      pioglitazone (ACTOS) 15 MG tablet      prochlorperazine (COMPAZINE) 10 MG tablet      Red Yeast Rice 600 MG CAPS      levofloxacin (LEVAQUIN) 750 MG tablet      ondansetron (ZOFRAN) 8 MG tablet         Allergies  Allergen Reactions  . Other     Steroids cause him to be very emotional and hyper.   Follow-up Information    Penland,Shannon Kristen, MD Follow up on 07/22/2016.   Specialties:  Hematology and Oncology, Oncology Why:  at 8:30 am. First labs. Then, Dr. Penland and chemo Contact information: [address] [place] [address] [phone]            The results of significant diagnostics from this hospitalization (including imaging, microbiology, ancillary and laboratory) are listed below for reference.    Significant Diagnostic Studies: Dg Chest 2 View  Result Date: 06/27/2016 CLINICAL DATA:  Shortness of breath and left-sided chest pain EXAM: CHEST  2 VIEW COMPARISON:  06/24/2016 FINDINGS: Cardiac shadow is stable. The lungs are well aerated bilaterally. No focal infiltrate or sizable effusion is seen. No bony abnormality is noted. Mild interstitial changes are   again seen. IMPRESSION: No acute abnormality noted. Electronically Signed   By: Inez Catalina M.D.   On: 06/27/2016 10:37   Ct Biopsy  Result Date: 06/14/2016 INDICATION: Pancreatic mass.  Liver masses. EXAM: CT BIOPSY MEDICATIONS: None. ANESTHESIA/SEDATION: Fentanyl 125 mcg IV; Versed 2 mg IV Moderate Sedation Time:  20 minutes The patient was continuously monitored during the procedure by the interventional radiology nurse under my direct supervision. FLUOROSCOPY TIME:  Fluoroscopy Time:  minutes  seconds ( mGy). COMPLICATIONS: None immediate. PROCEDURE: Informed written consent was obtained from the patient after a thorough discussion of the procedural risks, benefits and alternatives. All questions were addressed. Maximal Sterile Barrier Technique was utilized including caps, mask, sterile gowns, sterile gloves, sterile drape, hand hygiene and skin antiseptic. A timeout was performed prior to the initiation of the procedure. Under CT guidance, a(n) 17 gauge guide needle was advanced into the right lobe liver  lesion. Subsequently 3 18 gauge core biopsies were obtained. The guide needle was removed. Post biopsy images demonstrate no hemorrhage. Patient tolerated the procedure well without complication. Vital sign monitoring by nursing staff during the procedure will continue as patient is in the special procedures unit for post procedure observation. FINDINGS: The images document guide needle placement within the right lobe liver lesion. Post biopsy images demonstrate no hemorrhage. IMPRESSION: Successful CT-guided core biopsy of a right lobe liver lesion. Electronically Signed   By: Marybelle Killings M.D.   On: 06/14/2016 16:10   Dg Chest Port 1 View  Result Date: 07/04/2016 CLINICAL DATA:  Initial evaluation for acute hypotension. EXAM: PORTABLE CHEST 1 VIEW COMPARISON:  Prior radiograph from 06/27/2016. FINDINGS: Extent to a shin of the cardiac silhouette related AP technique. Mediastinal silhouette within normal limits. Aortic atherosclerosis noted. Lungs mildly hypoinflated. Mild pulmonary vascular congestion without overt pulmonary edema. No pleural effusion. No pneumothorax. No definite focal infiltrates. No acute osseous abnormality. IMPRESSION: Shallow lung inflation with mild diffuse pulmonary vascular congestion without overt pulmonary edema. Electronically Signed   By: Jeannine Boga M.D.   On: 07/04/2016 17:57   Ct Renal Stone Study  Result Date: 06/27/2016 CLINICAL DATA:  Left flank pain. Stage IV pancreatic adenocarcinoma with first chemotherapy treatment 3 days ago. EXAM: CT ABDOMEN AND PELVIS WITHOUT CONTRAST TECHNIQUE: Multidetector CT imaging of the abdomen and pelvis was performed following the standard protocol without IV contrast. COMPARISON:  05/31/2016 FINDINGS: Lower chest: Mild, partially nodular opacity is partially visualized in the right middle lobe, improved from the prior study which demonstrated more confluent consolidation in this location. 5 mm subpleural nodule in the left lower  lobe (series 6, image 5) and 5 mm nodule along the left major fissure (series 6, image 4) are unchanged. 4 mm right lower lobe nodule is also unchanged (series 6, image 13). There also a few additional subpleural nodules in the right lower lobe which measure 2-3 mm in size and may be infectious/inflammatory. No pleural effusion. Similar appearance of fat-containing hernia at the diaphragmatic hiatus with small amount of associated fluid. Coronary artery atherosclerosis. Hepatobiliary: Multiple hypoattenuating liver lesions are again seen, better demonstrated on the prior contrast-enhanced study and not substantially increased in size in the interim, with the largest measuring 3.9 cm in the left hepatic lobe. The gallbladder is mildly hydropic without evidence of wall thickening, pericholecystic inflammation, or biliary dilatation. Pancreas: Enlargement of the distal pancreatic body/tail is stable to minimally more prominent than on the prior study, with discrete measurement of the known underlying mass limited by the  lack of IV contrast. Adjacent subcentimeter in the adjacent pancreatic body is also again noted. Spleen: Multiple hypoattenuating splenic masses may have increased in size. Adrenals/Urinary Tract: 1.4 cm right adrenal nodule and 1.0 cm left adrenal nodule, unchanged. No renal calculi, hydronephrosis, or ureteral calculi identified. Unremarkable bladder. Stomach/Bowel: No evidence of bowel obstruction or inflammation. Mild sigmoid colon diverticulosis without evidence of diverticulitis. Unremarkable appendix. Vascular/Lymphatic: Advanced atherosclerosis of the abdominal aorta and its major branch vessels. Enlarged periportal lymph nodes measure up to 1.5 cm in short axis, not significantly changed. 12 mm short axis aortocaval lymph node is unchanged. Reproductive: Unremarkable prostate. Unchanged penile calcifications. Other: Unchanged fat-containing left inguinal hernia. Musculoskeletal: No suspicious  lytic or blastic osseous lesions identified. Prior L4-5 PLIF. Advanced L2-3 disc degeneration. IMPRESSION: 1. No evidence of urinary tract calculi or obstruction. 2. Findings of known metastatic pancreatic cancer with detailed assessment limited by the lack of IV contrast. Stable to minimally increased masslike enlargement of the pancreatic tail, suspected increased size of splenic metastases, similar size of liver metastases, and unchanged lymphadenopathy. 3. Unchanged small adrenal nodules. 4. Improved right middle lobe aeration with scattered small nodules in both lung bases, indeterminate. Electronically Signed   By: Logan Bores M.D.   On: 06/27/2016 12:44    Microbiology: Recent Results (from the past 240 hour(s))  Culture, blood (Routine X 2)     Status: None   Collection Time: 06/27/16  9:45 AM  Result Value Ref Range Status   Specimen Description BLOOD RIGHT ARM DRAWN BY RN R.M.  Final   Special Requests   Final    BOTTLES DRAWN AEROBIC AND ANAEROBIC AEB=8CC ANA=6CC   Culture NO GROWTH 5 DAYS  Final   Report Status 07/02/2016 FINAL  Final  Culture, blood (Routine X 2)     Status: None   Collection Time: 06/27/16  9:54 AM  Result Value Ref Range Status   Specimen Description BLOOD LEFT ANTECUBITAL  Final   Special Requests BOTTLES DRAWN AEROBIC AND ANAEROBIC 10CC EACH  Final   Culture NO GROWTH 5 DAYS  Final   Report Status 07/02/2016 FINAL  Final  Urine culture     Status: None   Collection Time: 06/27/16  2:30 PM  Result Value Ref Range Status   Specimen Description URINE, RANDOM  Final   Special Requests NONE  Final   Culture NO GROWTH Performed at [facility]   Final   Report Status 06/29/2016 FINAL  Final  MRSA PCR Screening     Status: None   Collection Time: 06/27/16  4:21 PM  Result Value Ref Range Status   MRSA by PCR NEGATIVE NEGATIVE Final    Comment:        The GeneXpert MRSA Assay (FDA approved for NASAL specimens only), is one component of  a comprehensive MRSA colonization surveillance program. It is not intended to diagnose MRSA infection nor to guide or monitor treatment for MRSA infections.   Culture, blood (Routine X 2) w Reflex to ID Panel     Status: None (Preliminary result)   Collection Time: 07/04/16  6:01 PM  Result Value Ref Range Status   Specimen Description BLOOD LEFT ARM  Final   Special Requests BOTTLES DRAWN AEROBIC AND ANAEROBIC 6CC  Final   Culture NO GROWTH 2 DAYS  Final   Report Status PENDING  Incomplete  Culture, blood (Routine X 2) w Reflex to ID Panel     Status: None (Preliminary result)   Collection Time:  07/04/16  6:04 PM  Result Value Ref Range Status   Specimen Description BLOOD LEFT HAND  Final   Special Requests BOTTLES DRAWN AEROBIC AND ANAEROBIC 6CC  Final   Culture NO GROWTH 2 DAYS  Final   Report Status PENDING  Incomplete  Urine culture     Status: None   Collection Time: 07/04/16  7:15 PM  Result Value Ref Range Status   Specimen Description URINE, CLEAN CATCH  Final   Special Requests NONE  Final   Culture NO GROWTH Performed at [facility]   Final   Report Status 07/06/2016 FINAL  Final     Labs: Basic Metabolic Panel:  Recent Labs Lab 07/01/16 0843 07/04/16 0848 07/04/16 1809 07/05/16 0639  NA 133* 135 136 135  K 4.3 4.1 3.7 3.4*  CL 96* 98* 99* 103  CO2 24 26  --  25  GLUCOSE 156* 118* 41* 46*  BUN 29* 42* 37* 29*  CREATININE 2.67* 2.38* 2.30* 1.30*  CALCIUM 8.7* 8.8*  --  7.6*   Liver Function Tests:  Recent Labs Lab 07/01/16 0843 07/04/16 0848 07/05/16 0639  AST 50* 48* 32  ALT 40 30 20  ALKPHOS 127* 160* 127*  BILITOT 0.9 0.7 0.7  PROT 6.9 6.4* 5.4*  ALBUMIN 3.0* 2.9* 2.3*   No results for input(s): LIPASE, AMYLASE in the last 168 hours. No results for input(s): AMMONIA in the last 168 hours. CBC:  Recent Labs Lab 07/01/16 0843 07/04/16 0848 07/04/16 1809 07/05/16 0639  WBC 2.0* 3.1*  --  4.5  NEUTROABS 1.0* 0.3*  --  1.4*   HGB 10.5* 11.3* 11.2* 9.7*  HCT 33.1* 35.0* 33.0* 30.3*  MCV 86.2 85.8  --  85.6  PLT 78* 161  --  135*   Cardiac Enzymes: No results for input(s): CKTOTAL, CKMB, CKMBINDEX, TROPONINI in the last 168 hours. BNP: BNP (last 3 results) No results for input(s): BNP in the last 8760 hours.  ProBNP (last 3 results) No results for input(s): PROBNP in the last 8760 hours.  CBG:  Recent Labs Lab 07/05/16 1118 07/05/16 1617 07/05/16 2116 07/06/16 0754 07/06/16 1117  GLUCAP 151* 219* 235* 154* 230*       Signed:  Raphaella Larkin MD.  [facility] 07/06/2016, 1:35 PM

## 2016-07-06 NOTE — Progress Notes (Signed)
Pt discharged home today per Dr. Fisher.  Pt's IV site D/C'd and WDL.  Pt's VSS.  Pt provided with home medication list, discharge instructions and prescriptions.  Verbalized understanding.  Pt left floor via WC in stable condition accompanied by NT. 

## 2016-07-08 ENCOUNTER — Ambulatory Visit (HOSPITAL_COMMUNITY): Payer: Medicare PPO | Admitting: Oncology

## 2016-07-08 ENCOUNTER — Ambulatory Visit (HOSPITAL_COMMUNITY): Payer: Medicare PPO | Admitting: Hematology & Oncology

## 2016-07-08 ENCOUNTER — Ambulatory Visit (HOSPITAL_COMMUNITY): Payer: Medicare PPO

## 2016-07-08 ENCOUNTER — Other Ambulatory Visit (HOSPITAL_COMMUNITY): Payer: Medicare PPO

## 2016-07-09 LAB — CULTURE, BLOOD (ROUTINE X 2)
CULTURE: NO GROWTH
CULTURE: NO GROWTH

## 2016-07-22 ENCOUNTER — Ambulatory Visit (HOSPITAL_COMMUNITY): Payer: Medicare PPO

## 2016-07-22 ENCOUNTER — Other Ambulatory Visit (HOSPITAL_COMMUNITY): Payer: Medicare PPO

## 2016-07-22 ENCOUNTER — Ambulatory Visit (HOSPITAL_COMMUNITY): Payer: Medicare PPO | Admitting: Hematology & Oncology

## 2016-07-29 ENCOUNTER — Ambulatory Visit (HOSPITAL_COMMUNITY): Payer: Medicare PPO

## 2016-07-29 ENCOUNTER — Other Ambulatory Visit (HOSPITAL_COMMUNITY): Payer: Medicare PPO

## 2016-08-26 DEATH — deceased

## 2016-09-23 IMAGING — US CT BIOPSY
1 series · 5 of 5 positions shown · non-contrast
Comparison: none

INDICATION: Pancreatic mass.  Liver masses.

[Series 1: ct biopsy · 0.23mm/px · 5 of 5 slices shown]
[im 1/5]
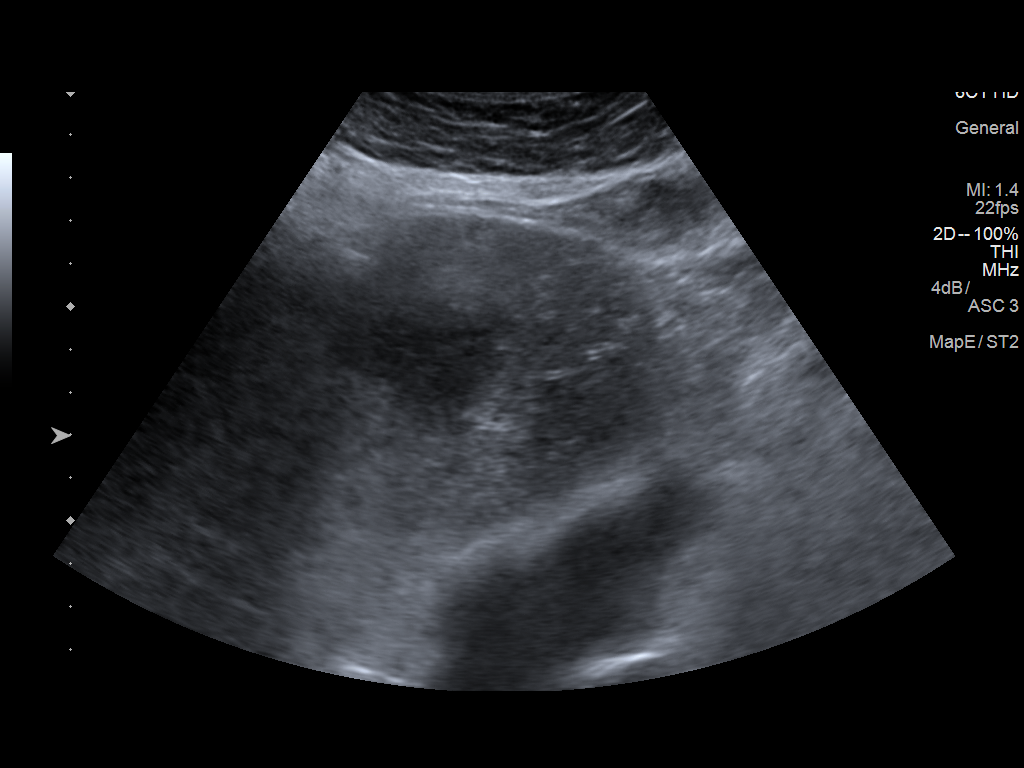
[im 2/5]
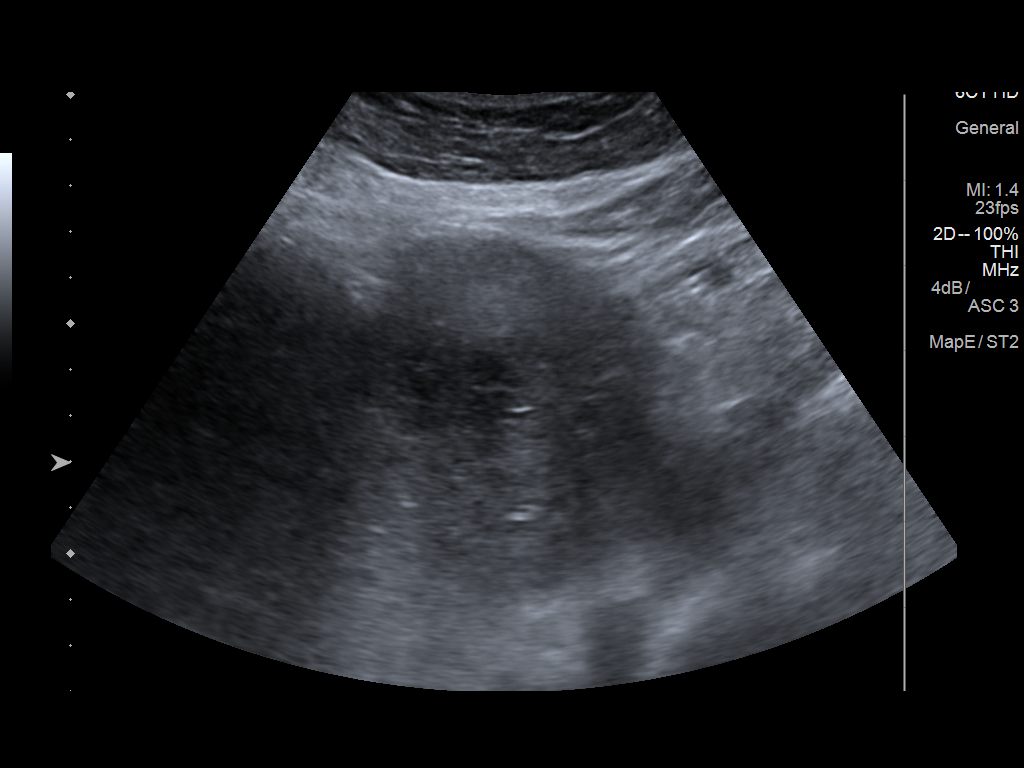
[im 3/5]
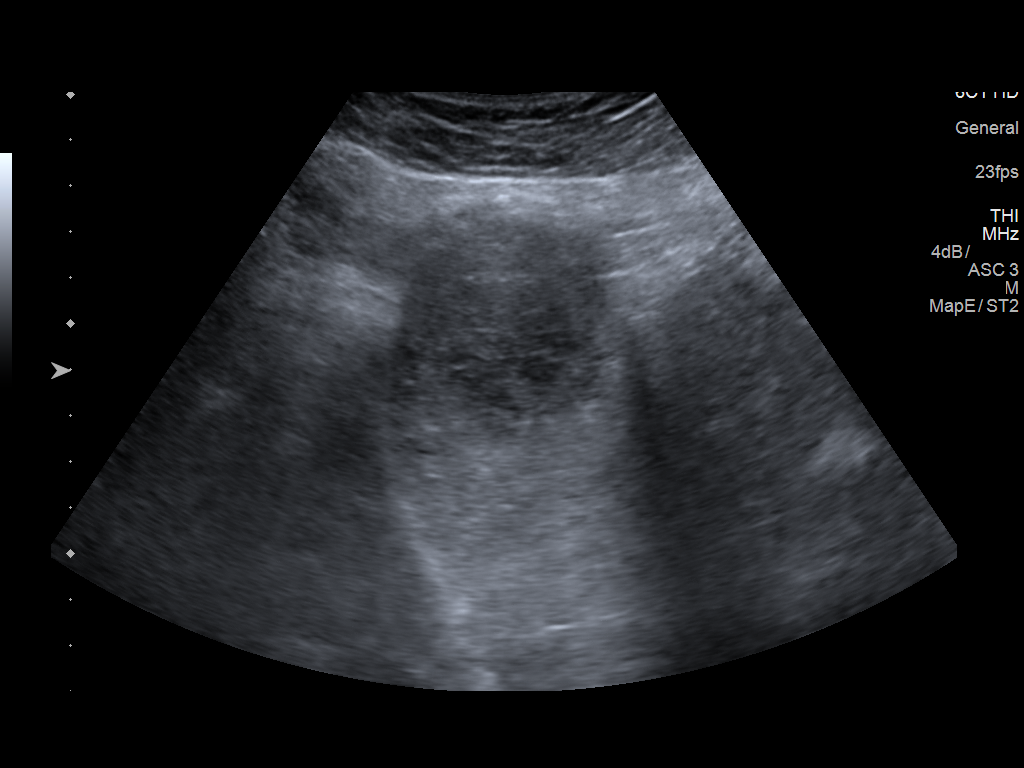
[im 4/5]
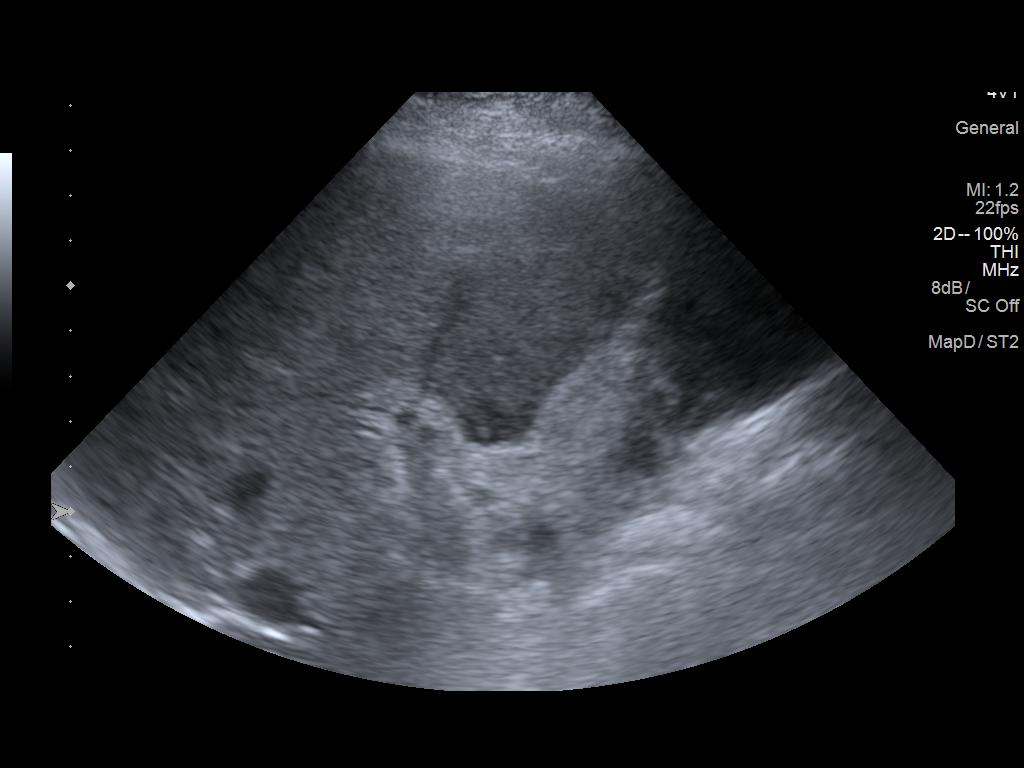
[im 5/5]
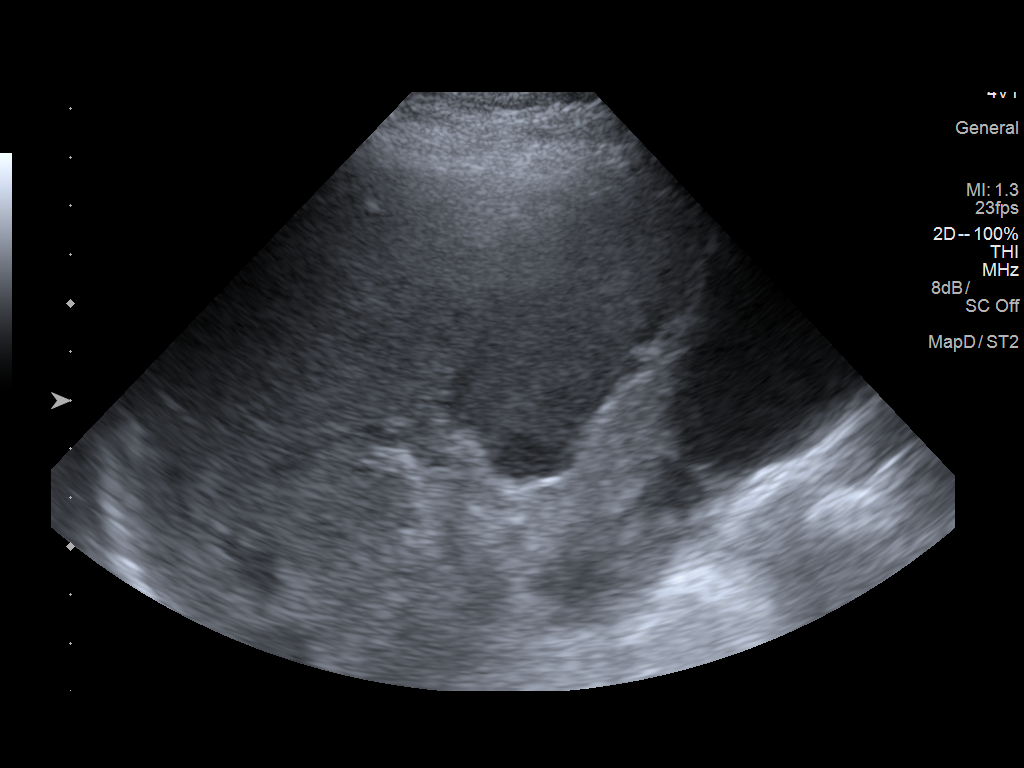

[5 of 5 positions shown; findings below may reference images not displayed]

EXAM:
CT BIOPSY

MEDICATIONS:
None.

ANESTHESIA/SEDATION:
Fentanyl 125 mcg IV; Versed 2 mg IV

Moderate Sedation Time:  20 minutes

The patient was continuously monitored during the procedure by the
interventional radiology nurse under my direct supervision.

FLUOROSCOPY TIME:  Fluoroscopy Time:  minutes  seconds ( mGy).

COMPLICATIONS:
None immediate.

PROCEDURE:
Informed written consent was obtained from the patient after a
thorough discussion of the procedural risks, benefits and
alternatives. All questions were addressed. Maximal Sterile Barrier
Technique was utilized including caps, mask, sterile gowns, sterile
gloves, sterile drape, hand hygiene and skin antiseptic. A timeout
was performed prior to the initiation of the procedure.

Under CT guidance, a(n) 17 gauge guide needle was advanced into the
right lobe liver lesion. Subsequently 3 18 gauge core biopsies were
obtained. The guide needle was removed. Post biopsy images
demonstrate no hemorrhage.

Patient tolerated the procedure well without complication. Vital
sign monitoring by nursing staff during the procedure will continue
as patient is in the special procedures unit for post procedure
observation.
FINDINGS: The images document guide needle placement within the right lobe
liver lesion. Post biopsy images demonstrate no hemorrhage.
IMPRESSION: Successful CT-guided core biopsy of a right lobe liver lesion.

## 2017-02-15 ENCOUNTER — Encounter: Payer: Self-pay | Admitting: Surgery

## 2017-02-27 ENCOUNTER — Encounter (HOSPITAL_COMMUNITY): Payer: Medicare PPO

## 2017-02-27 ENCOUNTER — Ambulatory Visit: Payer: Medicare PPO | Admitting: Surgery

## 2018-03-26 IMAGING — CT CT RENAL STONE PROTOCOL
2 of 4 series · 15 of 46 positions shown, 17 images · non-contrast
Comparison: 05/31/2016

CLINICAL DATA: Left flank pain. Stage IV pancreatic adenocarcinoma
with first chemotherapy treatment 3 days ago.

EXAM:
CT ABDOMEN AND PELVIS WITHOUT CONTRAST
TECHNIQUE: Multidetector CT imaging of the abdomen and pelvis was performed
following the standard protocol without IV contrast.

[Series 2: axial st · axial · 0.77mm/px · z∈[-471,-36]mm · 12 of 95 slices shown, 14 images]
[im 4/95  soft-tissue]
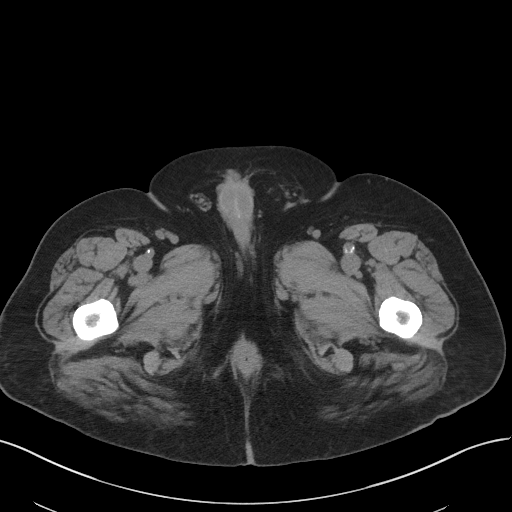
[im 4/95  bone]
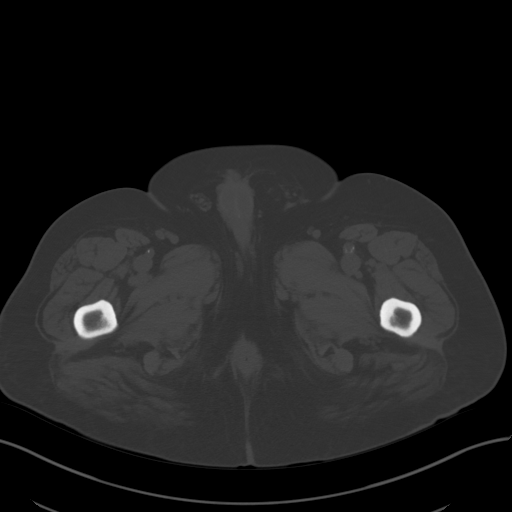
[im 12/95  soft-tissue]
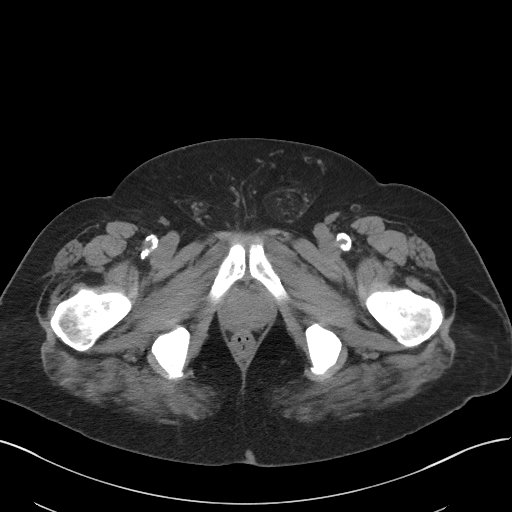
[im 20/95  soft-tissue]
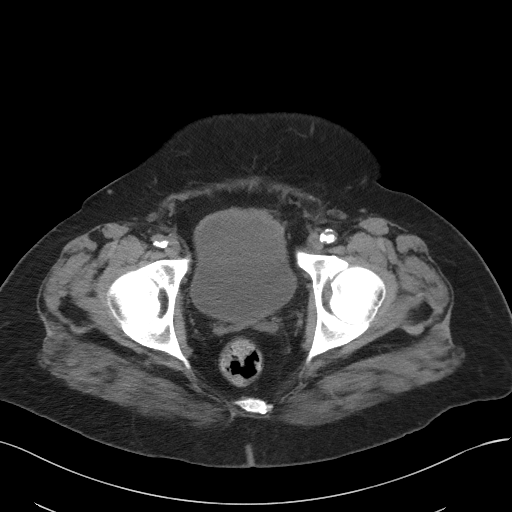
[im 28/95  soft-tissue]
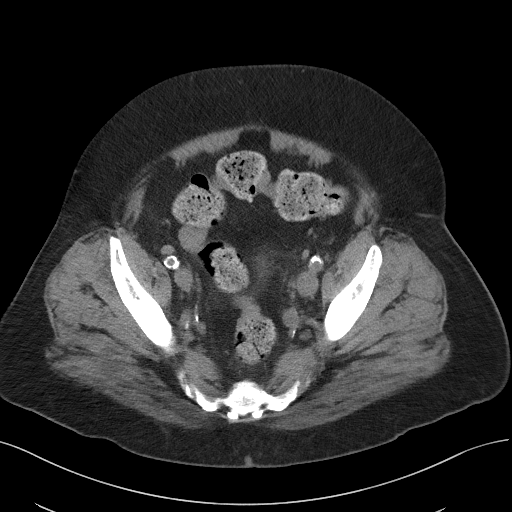
[im 36/95  soft-tissue]
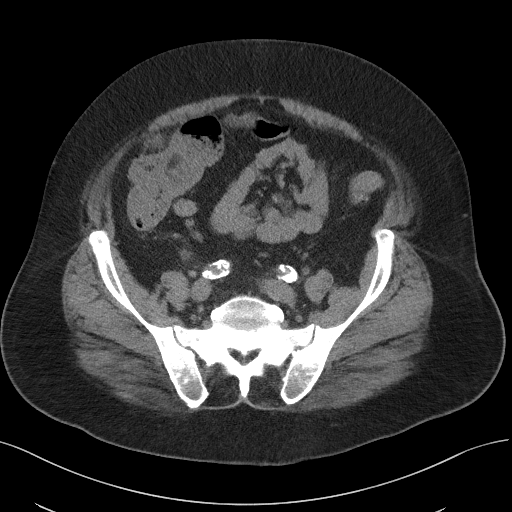
[im 44/95  soft-tissue]
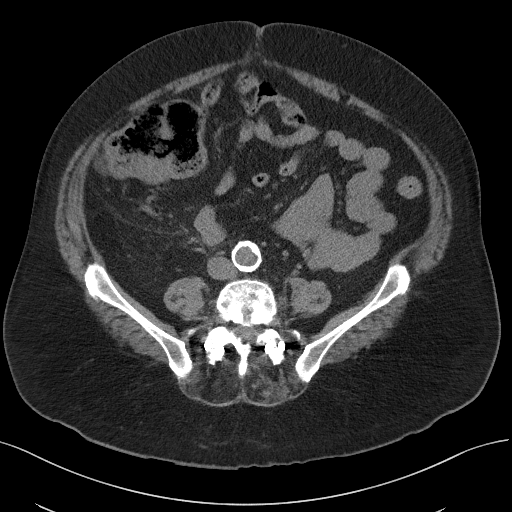
[im 51/95  soft-tissue]
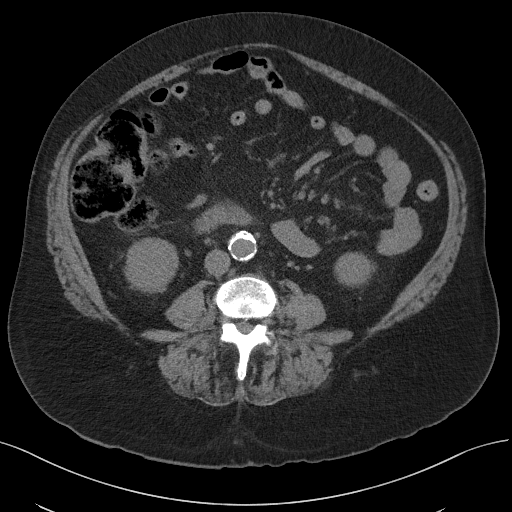
[im 59/95  soft-tissue]
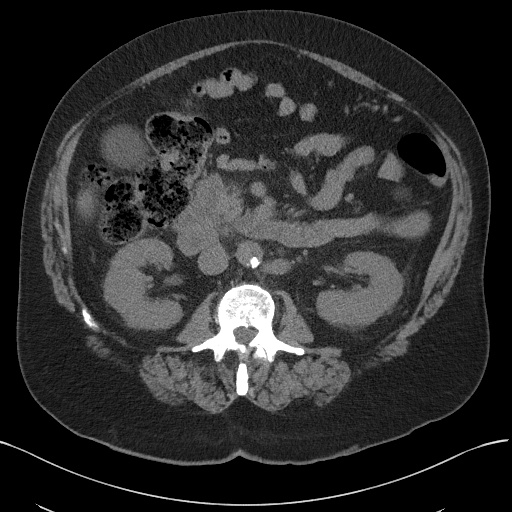
[im 67/95  soft-tissue]
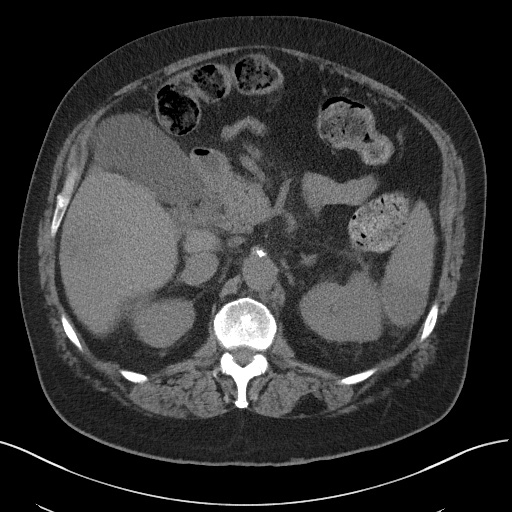
[im 67/95  bone]
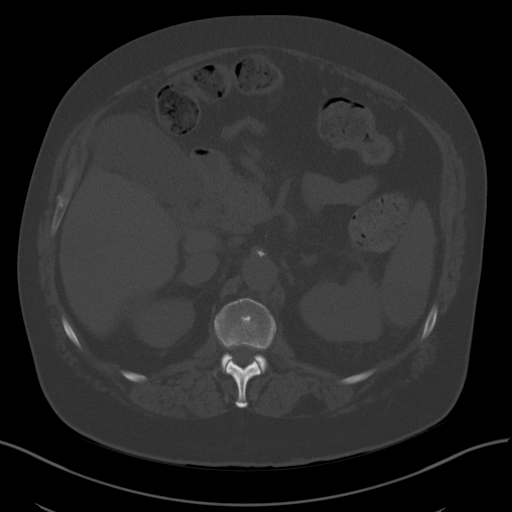
[im 75/95  soft-tissue]
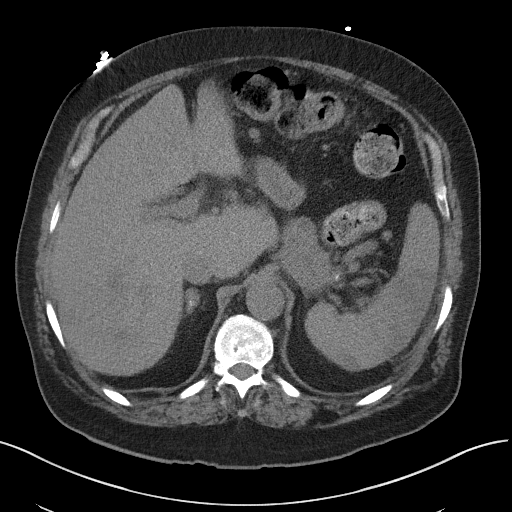
[im 83/95  soft-tissue]
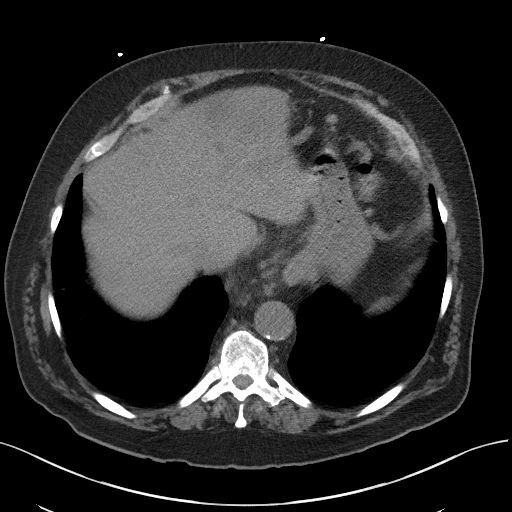
[im 91/95  soft-tissue]
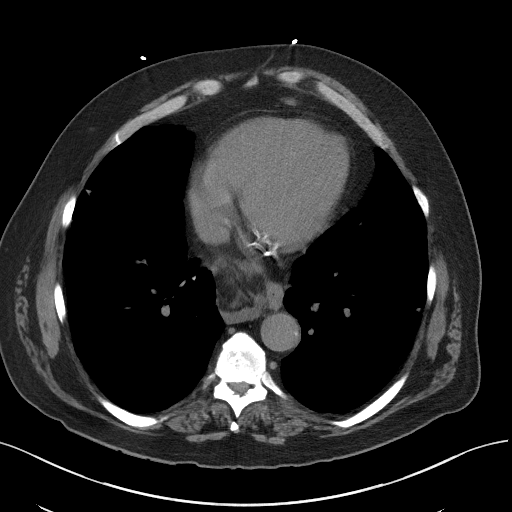

[Series 3: coronal st · coronal · 0.72mm/px · 3 of 112 slices shown]
[im 38/112  soft-tissue]
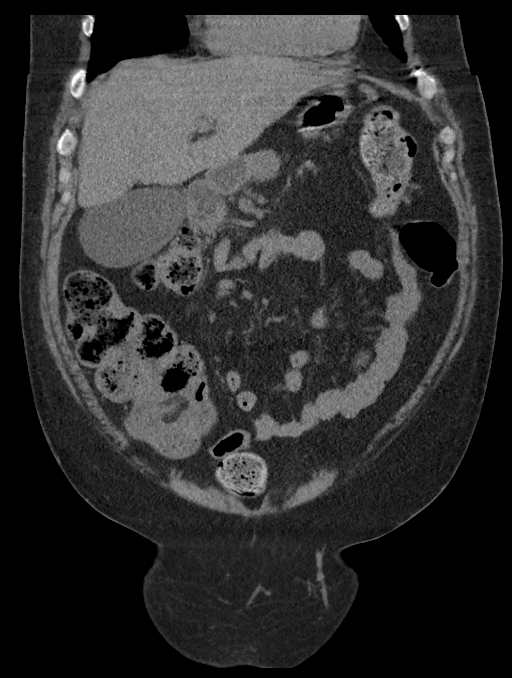
[im 50/112  soft-tissue]
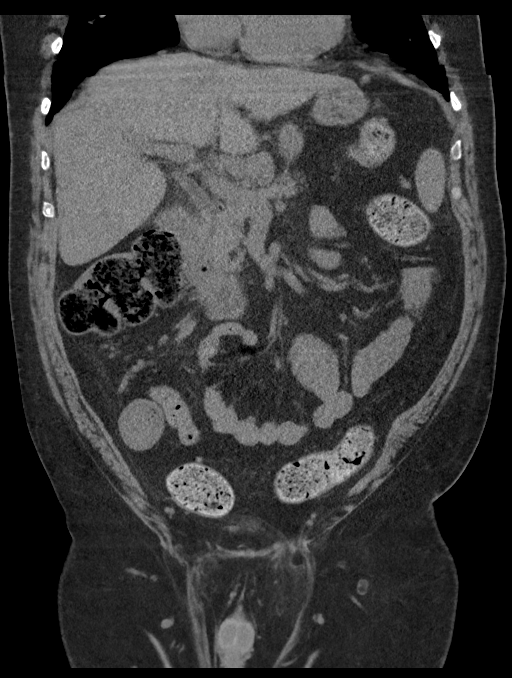
[im 62/112  soft-tissue]
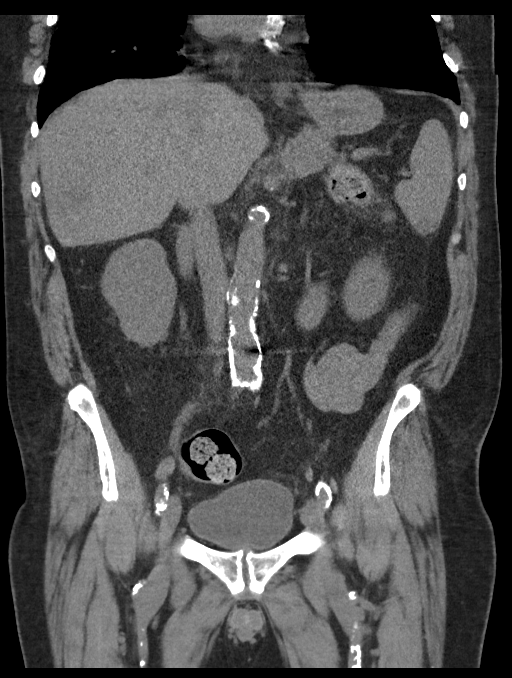

[15 of 46 positions shown; findings below may reference images not displayed]

FINDINGS: Lower chest: Mild, partially nodular opacity is partially visualized
in the right middle lobe, improved from the prior study which
demonstrated more confluent consolidation in this location. 5 mm
subpleural nodule in the left lower lobe (series 6, image 5) and 5
mm nodule along the left major fissure (series 6, image 4) are
unchanged. 4 mm right lower lobe nodule is also unchanged (series 6,
image 13). There also a few additional subpleural nodules in the
right lower lobe which measure 2-3 mm in size and may be
infectious/inflammatory. No pleural effusion. Similar appearance of
fat-containing hernia at the diaphragmatic hiatus with small amount
of associated fluid. Coronary artery atherosclerosis.

Hepatobiliary: Multiple hypoattenuating liver lesions are again
seen, better demonstrated on the prior contrast-enhanced study and
not substantially increased in size in the interim, with the largest
measuring 3.9 cm in the left hepatic lobe. The gallbladder is mildly
hydropic without evidence of wall thickening, pericholecystic
inflammation, or biliary dilatation.

Pancreas: Enlargement of the distal pancreatic body/tail is stable
to minimally more prominent than on the prior study, with discrete
measurement of the known underlying mass limited by the lack of IV
contrast. Adjacent subcentimeter in the adjacent pancreatic body is
also again noted.

Spleen: Multiple hypoattenuating splenic masses may have increased
in size.

Adrenals/Urinary Tract: 1.4 cm right adrenal nodule and 1.0 cm left
adrenal nodule, unchanged. No renal calculi, hydronephrosis, or
ureteral calculi identified. Unremarkable bladder.

Stomach/Bowel: No evidence of bowel obstruction or inflammation.
Mild sigmoid colon diverticulosis without evidence of
diverticulitis. Unremarkable appendix.

Vascular/Lymphatic: Advanced atherosclerosis of the abdominal aorta
and its major branch vessels. Enlarged periportal lymph nodes
measure up to 1.5 cm in short axis, not significantly changed. 12 mm
short axis aortocaval lymph node is unchanged.

Reproductive: Unremarkable prostate. Unchanged penile
calcifications.

Other: Unchanged fat-containing left inguinal hernia.

Musculoskeletal: No suspicious lytic or blastic osseous lesions
identified. Prior L4-5 PLIF. Advanced L2-3 disc degeneration.
IMPRESSION: 1. No evidence of urinary tract calculi or obstruction.
2. Findings of known metastatic pancreatic cancer with detailed
assessment limited by the lack of IV contrast. Stable to minimally
increased masslike enlargement of the pancreatic tail, suspected
increased size of splenic metastases, similar size of liver
metastases, and unchanged lymphadenopathy.
3. Unchanged small adrenal nodules.
4. Improved right middle lobe aeration with scattered small nodules
in both lung bases, indeterminate.

## 2018-10-30 ENCOUNTER — Other Ambulatory Visit: Payer: Self-pay | Admitting: Nurse Practitioner
